# Patient Record
Sex: Female | Born: 1995 | Race: White | Hispanic: No | Marital: Single | Smoking: Never smoker
Health system: Southern US, Community
[De-identification: ages and names within clinical notes are randomized; demographics above are authoritative.]

## PROBLEM LIST (undated history)

## (undated) DIAGNOSIS — D649 Anemia, unspecified: Secondary | ICD-10-CM

## (undated) DIAGNOSIS — Z8759 Personal history of other complications of pregnancy, childbirth and the puerperium: Secondary | ICD-10-CM

## (undated) DIAGNOSIS — Z8679 Personal history of other diseases of the circulatory system: Secondary | ICD-10-CM

## (undated) DIAGNOSIS — Z8619 Personal history of other infectious and parasitic diseases: Secondary | ICD-10-CM

## (undated) DIAGNOSIS — K8050 Calculus of bile duct without cholangitis or cholecystitis without obstruction: Secondary | ICD-10-CM

## (undated) DIAGNOSIS — Z9289 Personal history of other medical treatment: Secondary | ICD-10-CM

## (undated) HISTORY — PX: NO PAST SURGERIES: SHX2092

---

## 2014-11-21 DIAGNOSIS — Z87898 Personal history of other specified conditions: Secondary | ICD-10-CM

## 2014-11-21 DIAGNOSIS — Z8679 Personal history of other diseases of the circulatory system: Secondary | ICD-10-CM

## 2014-11-21 DIAGNOSIS — Z8619 Personal history of other infectious and parasitic diseases: Secondary | ICD-10-CM

## 2014-11-21 HISTORY — DX: Personal history of other infectious and parasitic diseases: Z86.19

## 2014-11-21 HISTORY — DX: Personal history of other specified conditions: Z87.898

## 2014-11-21 HISTORY — DX: Personal history of other diseases of the circulatory system: Z86.79

## 2014-12-13 ENCOUNTER — Emergency Department (HOSPITAL_COMMUNITY): Payer: Medicaid Other

## 2014-12-13 ENCOUNTER — Other Ambulatory Visit (HOSPITAL_COMMUNITY): Payer: Self-pay

## 2014-12-13 ENCOUNTER — Inpatient Hospital Stay (HOSPITAL_COMMUNITY)
Admission: EM | Admit: 2014-12-13 | Discharge: 2014-12-20 | DRG: 770 | Disposition: A | Discharge status: Home / Self Care | Payer: Medicaid Other | Attending: Internal Medicine | Admitting: Internal Medicine

## 2014-12-13 ENCOUNTER — Encounter (HOSPITAL_COMMUNITY): Payer: Self-pay | Admitting: Physical Medicine and Rehabilitation

## 2014-12-13 DIAGNOSIS — O0338 Urinary tract infection following incomplete spontaneous abortion: Secondary | ICD-10-CM | POA: Diagnosis present

## 2014-12-13 DIAGNOSIS — E860 Dehydration: Secondary | ICD-10-CM | POA: Diagnosis present

## 2014-12-13 DIAGNOSIS — R1011 Right upper quadrant pain: Secondary | ICD-10-CM | POA: Insufficient documentation

## 2014-12-13 DIAGNOSIS — O99612 Diseases of the digestive system complicating pregnancy, second trimester: Secondary | ICD-10-CM | POA: Diagnosis present

## 2014-12-13 DIAGNOSIS — R7881 Bacteremia: Secondary | ICD-10-CM | POA: Diagnosis present

## 2014-12-13 DIAGNOSIS — D62 Acute posthemorrhagic anemia: Secondary | ICD-10-CM | POA: Diagnosis not present

## 2014-12-13 DIAGNOSIS — D6959 Other secondary thrombocytopenia: Secondary | ICD-10-CM | POA: Diagnosis present

## 2014-12-13 DIAGNOSIS — Z9289 Personal history of other medical treatment: Secondary | ICD-10-CM

## 2014-12-13 DIAGNOSIS — Z452 Encounter for adjustment and management of vascular access device: Secondary | ICD-10-CM | POA: Insufficient documentation

## 2014-12-13 DIAGNOSIS — K8010 Calculus of gallbladder with chronic cholecystitis without obstruction: Secondary | ICD-10-CM | POA: Diagnosis present

## 2014-12-13 DIAGNOSIS — E872 Acidosis: Secondary | ICD-10-CM | POA: Diagnosis present

## 2014-12-13 DIAGNOSIS — J810 Acute pulmonary edema: Secondary | ICD-10-CM | POA: Diagnosis present

## 2014-12-13 DIAGNOSIS — Z791 Long term (current) use of non-steroidal anti-inflammatories (NSAID): Secondary | ICD-10-CM

## 2014-12-13 DIAGNOSIS — R6521 Severe sepsis with septic shock: Secondary | ICD-10-CM | POA: Diagnosis present

## 2014-12-13 DIAGNOSIS — D696 Thrombocytopenia, unspecified: Secondary | ICD-10-CM | POA: Diagnosis present

## 2014-12-13 DIAGNOSIS — Z3A16 16 weeks gestation of pregnancy: Secondary | ICD-10-CM | POA: Diagnosis present

## 2014-12-13 DIAGNOSIS — K8000 Calculus of gallbladder with acute cholecystitis without obstruction: Secondary | ICD-10-CM | POA: Diagnosis present

## 2014-12-13 DIAGNOSIS — O0332 Renal failure following incomplete spontaneous abortion: Secondary | ICD-10-CM | POA: Diagnosis present

## 2014-12-13 DIAGNOSIS — E876 Hypokalemia: Secondary | ICD-10-CM | POA: Insufficient documentation

## 2014-12-13 DIAGNOSIS — R000 Tachycardia, unspecified: Secondary | ICD-10-CM

## 2014-12-13 DIAGNOSIS — A419 Sepsis, unspecified organism: Secondary | ICD-10-CM | POA: Diagnosis present

## 2014-12-13 DIAGNOSIS — O0337 Sepsis following incomplete spontaneous abortion: Secondary | ICD-10-CM | POA: Diagnosis present

## 2014-12-13 DIAGNOSIS — F419 Anxiety disorder, unspecified: Secondary | ICD-10-CM | POA: Diagnosis present

## 2014-12-13 DIAGNOSIS — E8770 Fluid overload, unspecified: Secondary | ICD-10-CM | POA: Diagnosis present

## 2014-12-13 DIAGNOSIS — K7200 Acute and subacute hepatic failure without coma: Secondary | ICD-10-CM | POA: Diagnosis present

## 2014-12-13 DIAGNOSIS — E861 Hypovolemia: Secondary | ICD-10-CM | POA: Diagnosis present

## 2014-12-13 DIAGNOSIS — J811 Chronic pulmonary edema: Secondary | ICD-10-CM | POA: Diagnosis present

## 2014-12-13 DIAGNOSIS — O039 Complete or unspecified spontaneous abortion without complication: Secondary | ICD-10-CM | POA: Diagnosis present

## 2014-12-13 DIAGNOSIS — O0381 Shock following complete or unspecified spontaneous abortion: Secondary | ICD-10-CM | POA: Diagnosis not present

## 2014-12-13 LAB — I-STAT CG4 LACTIC ACID, ED
LACTIC ACID, VENOUS: 5.26 mmol/L — AB (ref 0.5–2.0)
Lactic Acid, Venous: 3.23 mmol/L (ref 0.5–2.0)

## 2014-12-13 LAB — CBC WITH DIFFERENTIAL/PLATELET
Basophils Absolute: 0 10*3/uL (ref 0.0–0.1)
Basophils Relative: 0 % (ref 0–1)
EOS PCT: 0 % (ref 0–5)
Eosinophils Absolute: 0 10*3/uL (ref 0.0–0.7)
HEMATOCRIT: 33 % — AB (ref 36.0–46.0)
Hemoglobin: 11.5 g/dL — ABNORMAL LOW (ref 12.0–15.0)
LYMPHS PCT: 3 % — AB (ref 12–46)
Lymphs Abs: 0.3 10*3/uL — ABNORMAL LOW (ref 0.7–4.0)
MCH: 29.3 pg (ref 26.0–34.0)
MCHC: 34.8 g/dL (ref 30.0–36.0)
MCV: 84 fL (ref 78.0–100.0)
Monocytes Absolute: 0.1 10*3/uL (ref 0.1–1.0)
Monocytes Relative: 1 % — ABNORMAL LOW (ref 3–12)
NEUTROS ABS: 9.8 10*3/uL — AB (ref 1.7–7.7)
NEUTROS PCT: 96 % — AB (ref 43–77)
Platelets: 111 10*3/uL — ABNORMAL LOW (ref 150–400)
RBC: 3.93 MIL/uL (ref 3.87–5.11)
RDW: 15.2 % (ref 11.5–15.5)
WBC: 10.2 10*3/uL (ref 4.0–10.5)

## 2014-12-13 LAB — COMPREHENSIVE METABOLIC PANEL
ALBUMIN: 2.6 g/dL — AB (ref 3.5–5.2)
ALT: 49 U/L — ABNORMAL HIGH (ref 0–35)
AST: 92 U/L — AB (ref 0–37)
Alkaline Phosphatase: 535 U/L — ABNORMAL HIGH (ref 39–117)
Anion gap: 11 (ref 5–15)
BUN: 16 mg/dL (ref 6–23)
CALCIUM: 8.7 mg/dL (ref 8.4–10.5)
CO2: 17 mmol/L — ABNORMAL LOW (ref 19–32)
CREATININE: 1.21 mg/dL — AB (ref 0.50–1.10)
Chloride: 103 mmol/L (ref 96–112)
GFR calc Af Amer: 74 mL/min — ABNORMAL LOW (ref >90)
GFR calc non Af Amer: 64 mL/min — ABNORMAL LOW (ref >90)
Glucose, Bld: 95 mg/dL (ref 70–99)
Potassium: 2.7 mmol/L — CL (ref 3.5–5.1)
SODIUM: 131 mmol/L — AB (ref 135–145)
Total Bilirubin: 2 mg/dL — ABNORMAL HIGH (ref 0.3–1.2)
Total Protein: 5.9 g/dL — ABNORMAL LOW (ref 6.0–8.3)

## 2014-12-13 LAB — URINE MICROSCOPIC-ADD ON

## 2014-12-13 LAB — URINALYSIS, ROUTINE W REFLEX MICROSCOPIC
Glucose, UA: NEGATIVE mg/dL
KETONES UR: 15 mg/dL — AB
NITRITE: POSITIVE — AB
PROTEIN: 100 mg/dL — AB
Specific Gravity, Urine: 1.017 (ref 1.005–1.030)
UROBILINOGEN UA: 1 mg/dL (ref 0.0–1.0)
pH: 5.5 (ref 5.0–8.0)

## 2014-12-13 LAB — I-STAT BETA HCG BLOOD, ED (MC, WL, AP ONLY)

## 2014-12-13 LAB — I-STAT CHEM 8, ED
BUN: 19 mg/dL (ref 6–23)
CALCIUM ION: 1.15 mmol/L (ref 1.12–1.23)
CHLORIDE: 105 mmol/L (ref 96–112)
CREATININE: 1.2 mg/dL — AB (ref 0.50–1.10)
Glucose, Bld: 116 mg/dL — ABNORMAL HIGH (ref 70–99)
HCT: 47 % — ABNORMAL HIGH (ref 36.0–46.0)
Hemoglobin: 16 g/dL — ABNORMAL HIGH (ref 12.0–15.0)
POTASSIUM: 4.3 mmol/L (ref 3.5–5.1)
Sodium: 144 mmol/L (ref 135–145)
TCO2: 25 mmol/L (ref 0–100)

## 2014-12-13 LAB — WET PREP, GENITAL
Clue Cells Wet Prep HPF POC: NONE SEEN
Trich, Wet Prep: NONE SEEN

## 2014-12-13 LAB — RH IG WORKUP (INCLUDES ABO/RH)
ABO/RH(D): A POS
GESTATIONAL AGE(WKS): 10

## 2014-12-13 LAB — RAPID URINE DRUG SCREEN, HOSP PERFORMED
Amphetamines: NOT DETECTED
Barbiturates: NOT DETECTED
Benzodiazepines: NOT DETECTED
Cocaine: NOT DETECTED
OPIATES: NOT DETECTED
TETRAHYDROCANNABINOL: NOT DETECTED

## 2014-12-13 LAB — LIPASE, BLOOD: LIPASE: 20 U/L (ref 11–59)

## 2014-12-13 MED ORDER — SODIUM CHLORIDE 0.9 % IV SOLN
Freq: Once | INTRAVENOUS | Status: AC
  Administered 2014-12-13: 19:00:00 via INTRAVENOUS

## 2014-12-13 MED ORDER — POTASSIUM CHLORIDE 10 MEQ/100ML IV SOLN
10.0000 meq | INTRAVENOUS | Status: AC
  Administered 2014-12-13 (×2): 10 meq via INTRAVENOUS
  Filled 2014-12-13 (×2): qty 100

## 2014-12-13 MED ORDER — SODIUM CHLORIDE 0.9 % IV BOLUS (SEPSIS)
1000.0000 mL | Freq: Once | INTRAVENOUS | Status: AC
Start: 2014-12-13 — End: 2014-12-13
  Administered 2014-12-13: 1000 mL via INTRAVENOUS

## 2014-12-13 MED ORDER — SODIUM CHLORIDE 0.9 % IV BOLUS (SEPSIS)
1000.0000 mL | Freq: Once | INTRAVENOUS | Status: AC
  Administered 2014-12-13: 1000 mL via INTRAVENOUS

## 2014-12-13 MED ORDER — PIPERACILLIN-TAZOBACTAM 3.375 G IVPB 30 MIN: 3.3750 g | Freq: Once | INTRAVENOUS | Status: DC

## 2014-12-13 MED ORDER — DEXTROSE 5 % IV SOLN
0.0000 ug/min | Freq: Once | INTRAVENOUS | Status: AC
  Administered 2014-12-13: 5 ug/min via INTRAVENOUS
  Filled 2014-12-13: qty 1

## 2014-12-13 MED ORDER — SODIUM CHLORIDE 0.9 % IV SOLN: 250.0000 mL | INTRAVENOUS | Status: DC | PRN

## 2014-12-13 MED ORDER — PIPERACILLIN-TAZOBACTAM IN DEX 2-0.25 GM/50ML IV SOLN
2.2500 g | Freq: Once | INTRAVENOUS | Status: AC
  Administered 2014-12-13: 2.25 g via INTRAVENOUS
  Filled 2014-12-13 (×2): qty 50

## 2014-12-13 MED ORDER — PHENYLEPHRINE HCL 10 MG/ML IJ SOLN
30.0000 ug/min | INTRAMUSCULAR | Status: DC
  Administered 2014-12-13: 30 ug/min via INTRAVENOUS
  Administered 2014-12-14: 65 ug/min via INTRAVENOUS
  Filled 2014-12-13 (×2): qty 1

## 2014-12-13 MED ORDER — ONDANSETRON HCL 4 MG/2ML IJ SOLN
4.0000 mg | Freq: Once | INTRAMUSCULAR | Status: AC
  Administered 2014-12-13: 4 mg via INTRAVENOUS
  Filled 2014-12-13: qty 2

## 2014-12-13 MED ORDER — POTASSIUM CHLORIDE 10 MEQ/100ML IV SOLN
10.0000 meq | Freq: Once | INTRAVENOUS | Status: AC
Start: 2014-12-13 — End: 2014-12-13
  Administered 2014-12-13: 10 meq via INTRAVENOUS
  Filled 2014-12-13: qty 100

## 2014-12-13 MED ORDER — ONDANSETRON HCL 4 MG/2ML IJ SOLN: 4.0000 mg | Freq: Four times a day (QID) | INTRAMUSCULAR | Status: DC | PRN

## 2014-12-13 MED ORDER — PIPERACILLIN-TAZOBACTAM 3.375 G IVPB
3.3750 g | Freq: Three times a day (TID) | INTRAVENOUS | Status: DC
Start: 2014-12-14 — End: 2014-12-18
  Administered 2014-12-14 – 2014-12-18 (×14): 3.375 g via INTRAVENOUS
  Filled 2014-12-13 (×17): qty 50

## 2014-12-13 MED ORDER — SODIUM CHLORIDE 0.9 % IV SOLN: INTRAVENOUS | Status: DC

## 2014-12-13 MED ORDER — HEPARIN SODIUM (PORCINE) 5000 UNIT/ML IJ SOLN
5000.0000 [IU] | Freq: Three times a day (TID) | INTRAMUSCULAR | Status: DC
  Administered 2014-12-14 – 2014-12-16 (×8): 5000 [IU] via SUBCUTANEOUS
  Filled 2014-12-13 (×12): qty 1

## 2014-12-13 NOTE — ED Notes (Addendum)
Floor called that room is ready

## 2014-12-13 NOTE — ED Notes (Signed)
ED NP at bedside

## 2014-12-13 NOTE — ED Notes (Signed)
Ultrasound at bedside

## 2014-12-13 NOTE — ED Notes (Signed)
2nd CG[facility]4 shown to [facility]

## 2014-12-13 NOTE — ED Notes (Signed)
Dr. Donnald GarrePfeiffer now requesting to give Zosyn.

## 2014-12-13 NOTE — Progress Notes (Signed)
ANTIBIOTIC CONSULT NOTE - INITIAL  Pharmacy Consult for Zosyn Indication: cholecystitis   No Known Allergies  Patient Measurements: Height: 5\' 5"  (165.1 cm) Weight: 105 lb (47.628 kg) IBW/kg (Calculated) : 57  Vital Signs: Temp: 98.9 F (37.2 C) (02/23 1425) Temp Source: Oral (02/23 1425) BP: 78/44 mmHg (02/23 2000) Pulse Rate: 105 (02/23 2000) Intake/Output from previous day:   Intake/Output from this shift:    Labs:  Recent Labs  12/13/14 1337 12/13/14 1835  WBC 10.2  --   HGB 11.5* 16.0*  PLT 111*  --   CREATININE 1.21* 1.20*   Estimated Creatinine Clearance: 56.2 mL/min (by C-G formula based on Cr of 1.2). No results for input(s): VANCOTROUGH, VANCOPEAK, VANCORANDOM, GENTTROUGH, GENTPEAK, GENTRANDOM, TOBRATROUGH, TOBRAPEAK, TOBRARND, AMIKACINPEAK, AMIKACINTROU, AMIKACIN in the last 72 hours.   Microbiology: Recent Results (from the past 720 hour(s))  Wet prep, genital     Status: Abnormal   Collection Time: 12/13/14  4:51 PM  Result Value Ref Range Status   Yeast Wet Prep HPF POC FEW (A) NONE SEEN Final   Trich, Wet Prep NONE SEEN NONE SEEN Final   Clue Cells Wet Prep HPF POC NONE SEEN NONE SEEN Final   WBC, Wet Prep HPF POC FEW (A) NONE SEEN Final    Medical History: History reviewed. No pertinent past medical history.  Medications:   (Not in a hospital admission)   Assessment: 19 yo F presents on 2/23 with vomiting and chills. Ultimately leading to septic shock. Pharmacy to dose Zosyn for cholecystitis. Pt is afebrile, WBC wnl. SCr 1.2, CrCl ~3656ml/min. Pt received Zosyn 2.25g IV x1 in the ED  Goal of Therapy:  Resolution of infection  Plan:  Start Zosyn 3.375g IV Q8 Monitor renal function, clinical picture  Armandina StammerBATCHELDER,Laith Antonelli J 12/13/2014,8:29 PM

## 2014-12-13 NOTE — ED Provider Notes (Signed)
CSN: [id number]     Arrival date & time 12/13/14  1250 History   First MD Initiated Contact with Patient 12/13/14 1351     Chief Complaint  Patient presents with  . Emesis     (Consider location/radiation/quality/duration/timing/severity/associated sxs/prior Treatment) HPI Comments: Pt comes with c/o right sided abdominal pain and vomiting times 3 over the last 4 days. Subjective fever. No diarrhea. Hasn't taken any medication for the symptoms. Denies cp or sob. No dysuria. lmp was 2/14. No vaginal discharge. Nothing makes the pain better or worse  The history is provided by the patient. No language interpreter was used.    History reviewed. No pertinent past medical history. History reviewed. No pertinent past surgical history. No family history on file. History  Substance Use Topics  . Smoking status: Never Smoker   . Smokeless tobacco: Not on file  . Alcohol Use: No   OB History    No data available     Review of Systems  All other systems reviewed and are negative.     Allergies  Review of patient's allergies indicates no known allergies.  Home Medications   Prior to Admission medications   Not on File   BP 130/103 mmHg  Pulse 138  Temp(Src) 98.3 F (36.8 C) (Oral)  Resp 18  Ht  (1.651 m)  Wt 105 lb (47.628 kg)  BMI 17.47 kg/m2  SpO2 100% Physical Exam  Constitutional: She is oriented to person, place, and time. She appears well-developed and well-nourished.  HENT:  Head: Normocephalic and atraumatic.  Cardiovascular: Normal rate and regular rhythm.   Pulmonary/Chest: Effort normal and breath sounds normal.  Abdominal: Soft. Bowel sounds are normal. There is tenderness in the right upper quadrant.  Genitourinary: No vaginal discharge found.  Musculoskeletal: Normal range of motion.  Neurological: She is alert and oriented to person, place, and time.  Skin: Skin is warm and dry.  Psychiatric: She has a normal mood and affect.  Nursing note and  vitals reviewed.   ED Course  CRITICAL CARE Performed by: Teressa Lower Authorized by: Teressa Lower Total critical care time: 30 minutes Critical care was time spent personally by me on the following activities: blood draw for specimens, discussions with consultants, examination of patient, evaluation of patient's response to treatment, ordering and review of laboratory studies and ordering and review of radiographic studies.   (including critical care time) Labs Review Labs Reviewed  CBC WITH DIFFERENTIAL/PLATELET - Abnormal; Notable for the following:    Hemoglobin 11.5 (*)    HCT 33.0 (*)    Platelets 111 (*)    Neutrophils Relative % 96 (*)    Lymphocytes Relative 3 (*)    Monocytes Relative 1 (*)    Neutro Abs 9.8 (*)    Lymphs Abs 0.3 (*)    All other components within normal limits  COMPREHENSIVE METABOLIC PANEL - Abnormal; Notable for the following:    Sodium 131 (*)    Potassium 2.7 (*)    CO2 17 (*)    Creatinine, Ser 1.21 (*)    Total Protein 5.9 (*)    Albumin 2.6 (*)    AST 92 (*)    ALT 49 (*)    Alkaline Phosphatase 535 (*)    Total Bilirubin 2.0 (*)    GFR calc non Af Amer 64 (*)    GFR calc Af Amer 74 (*)    All other components within normal limits  I-STAT CG4 LACTIC ACID, ED - Abnormal;  Notable for the following:    Lactic Acid, Venous 5.26 (*)    All other components within normal limits  I-STAT BETA HCG BLOOD, ED ([facility], [facility], [facility] ONLY) - Abnormal; Notable for the following:    I-stat hCG, quantitative >2000.0 (*)    All other components within normal limits  WET PREP, GENITAL  LIPASE, BLOOD  URINALYSIS, ROUTINE W REFLEX MICROSCOPIC  RPR  HIV ANTIBODY (ROUTINE TESTING)  RH IG WORKUP (INCLUDES ABO/RH)  GC/CHLAMYDIA PROBE AMP ([place])    Imaging Review No results found.   EKG Interpretation None      MDM   Final diagnoses:  RUQ pain    Spoke with Dr. Adrian Blackwaterstinson with ob after bedside ultrasound was done and pt is to have a  dating us to see if it is a viable pregnancy and and us of the ruq.pt continues to be alert and oriented. Pt is saying that she would like an abortion. Discussed with pt that it matters at how far along that she is:[facility]    Teressa LowerVrinda Chaela Branscum, NP 12/13/14 1727  Gerhard Munchobert Lockwood, MD 12/14/14 [phone]

## 2014-12-13 NOTE — ED Provider Notes (Signed)
18:40 The patient is reassessed upon returning from pelvic ultrasound. I have assumed care for Dr. Jeraldine LootsLockwood. She reports her pain is controlled if she is not moving around. She reports she continues to have the right upper quadrant pain which she has had since Friday (4 days ago) she denies the pain is radiating into her back. She indicates she has pain from her right upper quadrant to her right mid quadrant. She denies pain in her lower abdomen.  The patient's mental status is alert and appropriate. Her heart rate is 103 on the monitor. She has no respiratory distress and breath sounds are clear. Palpation of the abdomen reveals significant pain in the right upper quadrant. She denies pain to palpation in the lower abdomen.  Consultation has been made by Santiago GladHeather Laisure PA-C to general surgery. They have advised they will see the patient in consult. A consideration for choledocholithiasis was discussed and will be addressed by surgery. The plan will be for a medical admission. Earlier OB had already been consult by initial treating provider and at this time it was advised the patient would not be [facility] and [facility] medical and surgical care.  To this point the patient has received 4 L of normal saline and and peripheral supplemental potassium. The patient's lactic acid has improved however she does continue to be hypotensive with a clear mental status. Zosyn has been ordered previously for possible cholecystitis. Recheck shows her to have intact peripheral pulses radial and pedal. A Refill to the hands and feet is at 2-3 seconds. At this point internal medicine will be consulted and decision making regarding initiating pressors. The patient has reported that she does not wish to keep this pregnancy. She is a sophomore in college and has a number of years ahead of her education and does not feel that she is in an appropriate life situation for keeping the  pregnancy.  17:15 Case was reviewed with Dr. Kathy Breachakesh (intensivist), as  the patient remains hypotensive despite aggressive fluid rehydration Neo-Synephrine will be initiated. She will be assessed in the emergency department by the intensivist and Gen. surgery.  Arby BarretteMarcy Tycen Dockter, MD 12/13/14 [phone]

## 2014-12-13 NOTE — ED Provider Notes (Signed)
5:00 PM Patient signed out to me by Teressa LowerVrinda Pickering, NP at shift change.  Patient presented today with nausea, vomiting, and RUQ abdominal pain.  Urine pregnancy positive.  She did not know that she was pregnant prior to coming to the ED today.  Deliah BostonVrinda had contacted Dr. Adrian BlackwaterStinson with OB/GYN.  He states that the patient does not need to be [facility] and followed by OB/GYN unless pregnancy is viable beyond 23 weeks.  OB Ultrasound and RUQ Ultrasound pending.    IMPRESSION: 1. Gallbladder sludge and a gallstone is noted within gallbladder measures 1.1 cm. Mild thickening of gallbladder wall up to 3.4 mm. Although there is no sonographic Murphy's sign, early cholecystitis cannot be excluded. Clinical correlation is necessary. 2. Normal CBD. 3. No hydronephrosis.  Discussed results with the patient.  She reports that her pain is controlled at this time.  Patient continues to be hypotensive.    6:14 PM Discussed with Dr. Magnus IvanBlackman with General Surgery.  He recommends Medicine Admit and he will be available for consult.    IMPRESSION: 1. Single living intrauterine gestation. The estimated gestational age is 16 weeks and 1 day.  Discussed results of the ultrasound with the patient.    7:00 PM Discussed with Dr. Park BreedKahn with [facility].  He recommends consulting Critical Care.  7:15 PM Dr. Donnald GarrePfeiffer discussed the case with Intensivist who agreed to admit the patient and start pressors.  Santiago GladHeather Shynia Daleo, PA-C 12/15/14 2053  Arby BarretteMarcy Pfeiffer, MD 12/21/14 [phone]

## 2014-12-13 NOTE — H&P (Signed)
PULMONARY / CRITICAL CARE MEDICINE   Name: Jeanette Copeland MRN: [id number] DOB: Sep 28, 1996    ADMISSION DATE:  12/13/2014 CONSULTATION DATE:  12/13/2014  REFERRING MD :  [facility]  CHIEF COMPLAINT:  Septic shock  INITIAL PRESENTATION: Patient presented to the ED with vomiting and chills. She was found to be hypotensive, requiring 5L bolus of NS, to keep systolic pressures in 70s. Found to be pregnant on urine pregnancy with confirmation on ultrasound.  STUDIES:  Abdominal US: sludge with concern for cholecystitis Transvaginal US: Single IUP at 16w 1d  SIGNIFICANT EVENTS: 2/23: patient admitted   HISTORY OF PRESENT ILLNESS:  Patient is a 18yo G1 at [redacted]w[redacted]d that has had sharp right upper quadrant abdominal pain that does not radiate that started 4 days ago. Pain is constant throughout the day and not worse with or after meals. She has taken ibuprofen and Aleve which have not helped much with the pain. The only thing that has helped was placing a pillow on the right side of her back. Symptoms improved slightly three days ago, but returned the next day. Symptoms worsened with nausea, vomiting, headache and chills last night, which resolved this morning. She does not report associated fevers, chills, URI symptoms, urinary symptoms or GU (bleeding/discharge) symptoms. No sick contacts. She did not know she was pregnant prior to this ED admission.  PAST MEDICAL HISTORY :   has no past medical history on file.  has no past surgical history on file. Prior to Admission medications   Medication Sig Start Date End Date Taking? Authorizing Provider  ibuprofen (ADVIL,MOTRIN) 200 MG tablet Take 440 mg by mouth every 6 (six) hours as needed for mild pain.   Yes Historical Provider, MD   No Known Allergies  FAMILY HISTORY:  has no family status information on file.  SOCIAL HISTORY:  reports that she has never smoked. She does not have any smokeless tobacco history on file. She reports that she does  not drink alcohol or use illicit drugs.  REVIEW OF SYSTEMS:   Review of Systems  Constitutional: Positive for chills. Negative for fever, malaise/fatigue and diaphoresis.  Eyes: Negative for blurred vision and double vision.  Respiratory: Negative for cough, shortness of breath and wheezing.   Cardiovascular: Negative for chest pain and palpitations.  Gastrointestinal: Positive for nausea, vomiting and abdominal pain. Negative for diarrhea, constipation and blood in stool.  Genitourinary: Negative for dysuria, urgency and frequency.  Neurological: Positive for headaches. Negative for dizziness, tingling, tremors and loss of consciousness.  All other systems reviewed and are negative.     SUBJECTIVE: on pressors  VITAL SIGNS: Temp:  [98.3 F (36.8 C)-98.9 F (37.2 C)] 98.9 F (37.2 C) (02/23 1425) Pulse Rate:  [106-138] 106 (02/23 1840) Resp:  [18-27] 24 (02/23 1840) BP: (64-130)/(38-103) 72/42 mmHg (02/23 1840) SpO2:  [91 %-100 %] 99 % (02/23 1840) Weight:  [105 lb (47.628 kg)] 105 lb (47.628 kg) (02/23 1308) HEMODYNAMICS:   VENTILATOR SETTINGS:   INTAKE / OUTPUT:  Intake/Output Summary (Last 24 hours) at 12/13/14 1928 Last data filed at 12/13/14 1810  Gross per 24 hour  Intake   4300 ml  Output      0 ml  Net   4300 ml    PHYSICAL EXAMINATION: General:  Well appearing female, no distress Neuro:  Alert, oriented, moves extremities spontaneously. CN grossly intact HEENT:  [place]/AT Cardiovascular:  Regular rate and rhythm, no murmurs Lungs:  Clear to auscultation bilaterally, no wheezing Abdomen:  Soft,  RUQ tenderness, some guarding, no Murphy's sign, no LLQ tenderness, no rebound Musculoskeletal: No edema Skin:  No rashes, intact  LABS:  CBC  Recent Labs Lab 12/13/14 1337 12/13/14 1835  WBC 10.2  --   HGB 11.5* 16.0*  HCT 33.0* 47.0*  PLT 111*  --    Coag's No results for input(s): APTT, INR in the last 168 hours. BMET  Recent Labs Lab 12/13/14 1337  12/13/14 1835  NA 131* 144  K 2.7* 4.3  CL 103 105  CO2 17*  --   BUN 16 19  CREATININE 1.21* 1.20*  GLUCOSE 95 116*   Electrolytes  Recent Labs Lab 12/13/14 1337  CALCIUM 8.7   Sepsis Markers  Recent Labs Lab 12/13/14 1535 12/13/14 1835  LATICACIDVEN 5.26* 3.23*   ABG No results for input(s): PHART, PCO2ART, PO2ART in the last 168 hours. Liver Enzymes  Recent Labs Lab 12/13/14 1337  AST 92*  ALT 49*  ALKPHOS 535*  BILITOT 2.0*  ALBUMIN 2.6*   Cardiac Enzymes No results for input(s): TROPONINI, PROBNP in the last 168 hours. Glucose No results for input(s): GLUCAP in the last 168 hours.  Imaging [place] Abdomen Complete  12/13/2014   CLINICAL DATA:  Right upper quadrant pain, nausea  EXAM: ULTRASOUND ABDOMEN COMPLETE  COMPARISON:  None.  FINDINGS: Gallbladder: Gallbladder sludge and a gallstone is noted within gallbladder measures 1.1 cm. Mild thickening of gallbladder wall up to 3.4 mm. Although there is no sonographic Murphy's sign, early cholecystitis cannot be excluded. Clinical correlation is necessary.  Common bile duct: Diameter: 2.8 mm in diameter within normal limits.  Liver: No focal lesion identified. Within normal limits in parenchymal echogenicity.  IVC: No abnormality visualized.  Pancreas: Visualized portion unremarkable.  Spleen: Size and appearance within normal limits. Measures 10.2 cm in length. Accessory splenule measures 1.8 x 1.7 cm.  Right Kidney: Length: 11.9 cm. Echogenicity within normal limits. No mass or hydronephrosis visualized.  Left Kidney: Length: 11.7 cm. Echogenicity within normal limits. No mass or hydronephrosis visualized.  Abdominal aorta: No aneurysm visualized. Measures up to 1.6 cm in diameter.  Other findings: None.  IMPRESSION: 1. Gallbladder sludge and a gallstone is noted within gallbladder measures 1.1 cm. Mild thickening of gallbladder wall up to 3.4 mm. Although there is no sonographic Murphy's sign, early cholecystitis cannot be  excluded. Clinical correlation is necessary. 2. Normal CBD. 3. No hydronephrosis.   Electronically Signed   By: Natasha MeadLiviu  Pop M.D.   On: 12/13/2014 17:44   [place] Ob Transvaginal  12/13/2014   CLINICAL DATA:  Evaluate dates.  On known last menstrual.  EXAM: LIMITED OBSTETRIC ULTRASOUND  FINDINGS: Number of Fetuses: 1  Heart Rate:  150 bpm  Movement: Yes  Presentation: Cephalic  Placental Location: Anterior  Previa: No  Amniotic Fluid (Subjective):  Normal  BPD:  3.3cm 16w  1d  MATERNAL FINDINGS:  Cervix:  Appears closed.  Uterus/Adnexae:  No abnormality visualized.  IMPRESSION: 1. Single living intrauterine gestation. The estimated gestational age is 16 weeks and 1 day.  This exam is performed on an emergent basis and does not comprehensively evaluate fetal size, dating, or anatomy; follow-up complete OB US should be considered if further fetal assessment is warranted.   Electronically Signed   By: Signa Kellaylor  Stroud M.D.   On: 12/13/2014 19:02    ASSESSMENT / PLAN:  PULMONARY A: No active issues P:   Keep O2 sats greater than 94%  CARDIOVASCULAR A: Septic shock P:  Place central line  if neo not able to dc over next 1 hour cvp guidance Cortisol tsh Neosynephrine to keep MAP >65 Ensure lactic repeat Ensure 30 cc/kg fluid given  RENAL A:  AKI, hypovolemia?, hypokalemia, NONAG acidosis? Etiology? Assess if any loose stools, type B lactic? P:   Repeat bmet volume resus Repeat lactic acid for clearance  GASTROINTESTINAL A:  Acute cholecystitis P:   Surgery consulted, follow recs NPO NS @ 142ml/hr LFT follow up, agree may need GI consultation  Need lipase, amylase  HEMATOLOGIC A:  DVt prevention P:  Watch CBCs Sub q hep  INFECTIOUS A:  Septic shock; probable source from cholecystitis P:   BCx2 2/23 >>> UC 2/23 >>> GC/Chlamydia 2/23 >>> HIV 2/23 >>> RPR 2/23 >>> Abx: Zosyn, start date 2/23, day 1/x Received >30cc/kg NS in ED Trend lactic acid Pct may help for push for  cholecystectomy if plat or rising despite ABX  ENDOCRINE A:  No active issues   P:   Watch CBGs  NEUROLOGIC A:  No active issues P:   RASS goal: 0  REPRODUCTIVE A: G1 at 16w 1d by 2nd trimester [place] P: Avoid known teratogenic medication  FAMILY  - Updates: Family and patient updated at bedside  - Inter-disciplinary family meet or Palliative Care meeting due by: 12/19/2014    TODAY'S SUMMARY: 18yo G1 at [redacted]w[redacted]d with cholecystitis and septic shock requiring vasopressors.     Pulmonary and Critical Care Medicine [facility] Pager: [phone]  12/13/2014, 7:28 PM  STAFF NOTE: I, Rory Percy, MD FACP have personally reviewed patient's available data, including medical history, events of note, physical examination and test results as part of my evaluation. I have discussed with resident/NP and other care providers such as pharmacist, RN and RRT. In addition, I personally evaluated patient and elicited key findings of: septic shock, appreciate gen surgery recs, zosyn, needs line placement, cortisol, cvp goal 12, may need CT abdo (with shielding), may ned ERCP as we look for stone in CBD?, assess lipase, II updated pt and mom, she has receoved well over her 30 cc/kg and neo is rising, with new pregnancy consider MAP 65-70 The patient is critically ill with multiple organ systems failure and requires high complexity decision making for assessment and support, frequent evaluation and titration of therapies, application of advanced monitoring technologies and extensive interpretation of multiple databases.   Critical Care Time devoted to patient care services described in this note is35 Minutes. This time reflects time of care of this signee: Rory Percy, MD FACP. This critical care time does not reflect procedure time, or teaching time or supervisory time of PA/NP/Med student/Med Resident etc but could involve care discussion time. Rest per NP/medical resident whose note  is outlined above and that I agree with   Mcarthur Rossetti. Tyson Alias, MD, FACP Pgr: [phone] [facility] 12/13/2014 11:22 PM

## 2014-12-13 NOTE — Consult Note (Signed)
Reason for Consult:possible cholecystitis Referring Physician: Dr. Veryl Speak Jeanette Copeland is an 19 y.o. female.  HPI: This is a 19 year old female who presents with a four-day history of abdominal pain, nausea, and vomiting. She reports that the pain began in the right upper quadrant on Friday. She has had multiple episodes of emesis. She presented to the emergency department earlier today. During the workup, an ultrasound showed a thickened gallbladder wall. Bile duct was normal. She was found to be dehydrated with an elevated lactic acid level and elevated liver function tests including an elevated bilirubin of 2.0. She was also found to be pregnant with an ultrasound confirming an approximate [redacted] week gestational pregnancy. She has been hypertensive and tachycardic in the emergency department. Currently, she denies abdominal pain although she is tender in the right upper quadrant. She has ongoing nausea with dry heaves. She has no previous history of abdominal discomfort. Until this point, she has been healthy.  History reviewed. No pertinent past medical history.  History reviewed. No pertinent past surgical history.  No family history on file.  Social History:  reports that she has never smoked. She does not have any smokeless tobacco history on file. She reports that she does not drink alcohol or use illicit drugs.  Allergies: No Known Allergies  Medications: I have reviewed the patient's current medications.  Results for orders placed or performed during the hospital encounter of 12/13/14 (from the past 48 hour(s))  CBC with Differential     Status: Abnormal   Collection Time: 12/13/14  1:37 PM  Result Value Ref Range   WBC 10.2 4.0 - 10.5 K/uL   RBC 3.93 3.87 - 5.11 MIL/uL   Hemoglobin 11.5 (L) 12.0 - 15.0 g/dL   HCT 33.0 (L) 36.0 - 46.0 %   MCV 84.0 78.0 - 100.0 fL   MCH 29.3 26.0 - 34.0 pg   MCHC 34.8 30.0 - 36.0 g/dL   RDW 15.2 11.5 - 15.5 %   Platelets 111 (L) 150 - 400 K/uL     Comment: REPEATED TO VERIFY PLATELET COUNT CONFIRMED BY SMEAR    Neutrophils Relative % 96 (H) 43 - 77 %   Lymphocytes Relative 3 (L) 12 - 46 %   Monocytes Relative 1 (L) 3 - 12 %   Eosinophils Relative 0 0 - 5 %   Basophils Relative 0 0 - 1 %   Neutro Abs 9.8 (H) 1.7 - 7.7 K/uL   Lymphs Abs 0.3 (L) 0.7 - 4.0 K/uL   Monocytes Absolute 0.1 0.1 - 1.0 K/uL   Eosinophils Absolute 0.0 0.0 - 0.7 K/uL   Basophils Absolute 0.0 0.0 - 0.1 K/uL   RBC Morphology POLYCHROMASIA PRESENT     Comment: ELLIPTOCYTES   WBC Morphology TOXIC GRANULATION     Comment: DOHLE BODIES VACUOLATED NEUTROPHILS INCREASED BANDS (>20% BANDS)   Comprehensive metabolic panel     Status: Abnormal   Collection Time: 12/13/14  1:37 PM  Result Value Ref Range   Sodium 131 (L) 135 - 145 mmol/L   Potassium 2.7 (LL) 3.5 - 5.1 mmol/L    Comment: CRITICAL RESULT CALLED TO, READ BACK BY AND VERIFIED WITH: GROSE J RN 12/13/14 1428 COSTELLO B REPEATED TO VERIFY    Chloride 103 96 - 112 mmol/L   CO2 17 (L) 19 - 32 mmol/L   Glucose, Bld 95 70 - 99 mg/dL   BUN 16 6 - 23 mg/dL   Creatinine, Ser 1.21 (H) 0.50 - 1.10 mg/dL  Calcium 8.7 8.4 - 10.5 mg/dL   Total Protein 5.9 (L) 6.0 - 8.3 g/dL   Albumin 2.6 (L) 3.5 - 5.2 g/dL   AST 92 (H) 0 - 37 U/L   ALT 49 (H) 0 - 35 U/L   Alkaline Phosphatase 535 (H) 39 - 117 U/L   Total Bilirubin 2.0 (H) 0.3 - 1.2 mg/dL   GFR calc non Af Amer 64 (L) >90 mL/min   GFR calc Af Amer 74 (L) >90 mL/min    Comment: (NOTE) The eGFR has been calculated using the CKD EPI equation. This calculation has not been validated in all clinical situations. eGFR's persistently <90 mL/min signify possible Chronic Kidney Disease.    Anion gap 11 5 - 15  Lipase, blood     Status: None   Collection Time: 12/13/14  1:37 PM  Result Value Ref Range   Lipase 20 11 - 59 U/L  I-Stat Beta hCG blood, ED ([facility], [facility], [facility] only)     Status: Abnormal   Collection Time: 12/13/14  3:33 PM  Result Value Ref Range    I-stat hCG, quantitative >2000.0 (H) <5 mIU/mL   Comment 3            Comment:   GEST. AGE      CONC.  (mIU/mL)   <=1 WEEK        5 - 50     2 WEEKS       50 - 500     3 WEEKS       100 - 10,000     4 WEEKS     1,000 - 30,000        FEMALE AND NON-PREGNANT FEMALE:     LESS THAN 5 mIU/mL   I-Stat CG4 Lactic Acid, ED     Status: Abnormal   Collection Time: 12/13/14  3:35 PM  Result Value Ref Range   Lactic Acid, Venous 5.26 (HH) 0.5 - 2.0 mmol/L   Comment NOTIFIED PHYSICIAN   Wet prep, genital     Status: Abnormal   Collection Time: 12/13/14  4:51 PM  Result Value Ref Range   Yeast Wet Prep HPF POC FEW (A) NONE SEEN   Trich, Wet Prep NONE SEEN NONE SEEN   Clue Cells Wet Prep HPF POC NONE SEEN NONE SEEN   WBC, Wet Prep HPF POC FEW (A) NONE SEEN  Rh IG workup (includes ABO/Rh)     Status: None   Collection Time: 12/13/14  6:13 PM  Result Value Ref Range   Gestational Age(Wks) 10    ABO/RH(D) A POS    No rh immune globuloin NOT A RH IMMUNE GLOBULIN CANDIDATE, PT RH POSITIVE   I-Stat CG4 Lactic Acid, ED     Status: Abnormal   Collection Time: 12/13/14  6:35 PM  Result Value Ref Range   Lactic Acid, Venous 3.23 (HH) 0.5 - 2.0 mmol/L   Comment NOTIFIED PHYSICIAN   I-stat chem 8, ed     Status: Abnormal   Collection Time: 12/13/14  6:35 PM  Result Value Ref Range   Sodium 144 135 - 145 mmol/L   Potassium 4.3 3.5 - 5.1 mmol/L   Chloride 105 96 - 112 mmol/L   BUN 19 6 - 23 mg/dL   Creatinine, Ser 1.20 (H) 0.50 - 1.10 mg/dL   Glucose, Bld 116 (H) 70 - 99 mg/dL   Calcium, Ion 1.15 1.12 - 1.23 mmol/L   TCO2 25 0 - 100 mmol/L   Hemoglobin  16.0 (H) 12.0 - 15.0 g/dL   HCT 47.0 (H) 36.0 - 46.0 %    US Abdomen Complete  12/13/2014   CLINICAL DATA:  Right upper quadrant pain, nausea  EXAM: ULTRASOUND ABDOMEN COMPLETE  COMPARISON:  None.  FINDINGS: Gallbladder: Gallbladder sludge and a gallstone is noted within gallbladder measures 1.1 cm. Mild thickening of gallbladder wall up to 3.4 mm.  Although there is no sonographic Murphy's sign, early cholecystitis cannot be excluded. Clinical correlation is necessary.  Common bile duct: Diameter: 2.8 mm in diameter within normal limits.  Liver: No focal lesion identified. Within normal limits in parenchymal echogenicity.  IVC: No abnormality visualized.  Pancreas: Visualized portion unremarkable.  Spleen: Size and appearance within normal limits. Measures 10.2 cm in length. Accessory splenule measures 1.8 x 1.7 cm.  Right Kidney: Length: 11.9 cm. Echogenicity within normal limits. No mass or hydronephrosis visualized.  Left Kidney: Length: 11.7 cm. Echogenicity within normal limits. No mass or hydronephrosis visualized.  Abdominal aorta: No aneurysm visualized. Measures up to 1.6 cm in diameter.  Other findings: None.  IMPRESSION: 1. Gallbladder sludge and a gallstone is noted within gallbladder measures 1.1 cm. Mild thickening of gallbladder wall up to 3.4 mm. Although there is no sonographic Murphy's sign, early cholecystitis cannot be excluded. Clinical correlation is necessary. 2. Normal CBD. 3. No hydronephrosis.   Electronically Signed   By: Lahoma Crocker M.D.   On: 12/13/2014 17:44   US Ob Transvaginal  12/13/2014   CLINICAL DATA:  Evaluate dates.  On known last menstrual.  EXAM: LIMITED OBSTETRIC ULTRASOUND  FINDINGS: Number of Fetuses: 1  Heart Rate:  150 bpm  Movement: Yes  Presentation: Cephalic  Placental Location: Anterior  Previa: No  Amniotic Fluid (Subjective):  Normal  BPD:  3.3cm 16w  1d  MATERNAL FINDINGS:  Cervix:  Appears closed.  Uterus/Adnexae:  No abnormality visualized.  IMPRESSION: 1. Single living intrauterine gestation. The estimated gestational age is 57 weeks and 1 day.  This exam is performed on an emergent basis and does not comprehensively evaluate fetal size, dating, or anatomy; follow-up complete OB US should be considered if further fetal assessment is warranted.   Electronically Signed   By: Kerby Moors M.D.   On:  12/13/2014 19:02    Review of Systems  Constitutional: Positive for chills.  Gastrointestinal: Positive for nausea, vomiting and abdominal pain.   Blood pressure 78/44, pulse 105, temperature 98.9 F (37.2 C), temperature source Oral, resp. rate 24, height 5' 5"  (1.651 m), weight 105 lb (47.628 kg), SpO2 100 %. Physical Exam  Constitutional: She is oriented to person, place, and time. She appears well-developed and well-nourished. She appears distressed.  Small female currently having dry heaves  HENT:  Head: Normocephalic and atraumatic.  Right Ear: External ear normal.  Left Ear: External ear normal.  Nose: Nose normal.  Mouth/Throat: No oropharyngeal exudate.  Eyes: Conjunctivae are normal. Pupils are equal, round, and reactive to light. No scleral icterus.  Neck: Normal range of motion. No tracheal deviation present.  Cardiovascular: Regular rhythm, normal heart sounds and intact distal pulses.   No murmur heard. Tachycardic  Respiratory: Effort normal and breath sounds normal. No respiratory distress. She has no wheezes.  GI: Soft. She exhibits no distension. There is tenderness. There is guarding.  There is tenderness with guarding in the right upper quadrant.  Musculoskeletal: Normal range of motion. She exhibits no edema or tenderness.  Lymphadenopathy:    She has no cervical adenopathy.  Neurological:  She is alert and oriented to person, place, and time.  Skin: Skin is dry. No erythema.  Psychiatric: Her behavior is normal. Judgment normal.    Assessment/Plan: Acute cholecystitis complicated by possible choledocholithiasis and cholangitis as well as early pregnancy. She is quite dehydrated. Fluid resuscitation is ongoing. Some of the nausea and vomiting may be secondary to the pregnancy. She is being admitted to the hospital. If her liver function tests increase, she will need GI consultation to consider ERCP with bile duct decompression. If her test improved, she may need  cholecystectomy tomorrow. We may need to consider a CAT scan of her abdomen if the diagnosis does not remain totally certain despite the pregnancy. I did not discuss the pregnancy with her as there was family present and she did not want this discussed in front of family apparently. We will follow her closely with you.  Mehul Rudin A 12/13/2014, 8:23 PM

## 2014-12-13 NOTE — ED Notes (Signed)
Foye ClockKristina, EMT attempted in and out cath without urine return. Deliah BostonVrinda NP aware.

## 2014-12-13 NOTE — ED Notes (Signed)
Pickering, NP notified of patients potassium.

## 2014-12-13 NOTE — ED Notes (Addendum)
Patient requesting that anything related to her care not be discussed with or in front of anyone but herself unless she has given permission.

## 2014-12-13 NOTE — ED Notes (Signed)
CG-4 reported to HungaryVarinda Pickering-NP

## 2014-12-13 NOTE — ED Notes (Signed)
Pt presents to department for evaluation of R sided rib pain, nausea/vomiting and fatigue. Ongoing since Friday. Denies pain upon arrival. Pt is alert and oriented x4.

## 2014-12-13 NOTE — ED Notes (Signed)
Patient is unable to give a urine specimen at this time  

## 2014-12-13 NOTE — ED Notes (Signed)
Spoke with US, requested bedside US to be performed due to need for continuous monitoring

## 2014-12-13 NOTE — ED Notes (Signed)
Writer called and Camera operatorfloor secretary states that bed is brought but waiting for environmental services to clean the bed.

## 2014-12-13 NOTE — ED Notes (Signed)
Report given to floor RN, Huntley DecSara, states no bed in the room yet. States will call when bed is brought to room.

## 2014-12-13 NOTE — ED Notes (Signed)
Spoke with US regarding need [facility].

## 2014-12-14 ENCOUNTER — Encounter (HOSPITAL_COMMUNITY): Payer: Self-pay | Admitting: Radiology

## 2014-12-14 ENCOUNTER — Inpatient Hospital Stay (HOSPITAL_COMMUNITY): Payer: Medicaid Other

## 2014-12-14 DIAGNOSIS — J810 Acute pulmonary edema: Secondary | ICD-10-CM

## 2014-12-14 DIAGNOSIS — E876 Hypokalemia: Secondary | ICD-10-CM | POA: Insufficient documentation

## 2014-12-14 DIAGNOSIS — R1011 Right upper quadrant pain: Secondary | ICD-10-CM | POA: Insufficient documentation

## 2014-12-14 DIAGNOSIS — J811 Chronic pulmonary edema: Secondary | ICD-10-CM | POA: Insufficient documentation

## 2014-12-14 DIAGNOSIS — Z452 Encounter for adjustment and management of vascular access device: Secondary | ICD-10-CM | POA: Insufficient documentation

## 2014-12-14 LAB — COMPREHENSIVE METABOLIC PANEL
ALBUMIN: 1.9 g/dL — AB (ref 3.5–5.2)
ALT: 47 U/L — ABNORMAL HIGH (ref 0–35)
ANION GAP: 9 (ref 5–15)
AST: 87 U/L — ABNORMAL HIGH (ref 0–37)
Alkaline Phosphatase: 130 U/L — ABNORMAL HIGH (ref 39–117)
BUN: 15 mg/dL (ref 6–23)
CO2: 14 mmol/L — ABNORMAL LOW (ref 19–32)
CREATININE: 1.09 mg/dL (ref 0.50–1.10)
Calcium: 6.9 mg/dL — ABNORMAL LOW (ref 8.4–10.5)
Chloride: 114 mmol/L — ABNORMAL HIGH (ref 96–112)
GFR calc Af Amer: 84 mL/min — ABNORMAL LOW (ref >90)
GFR calc non Af Amer: 73 mL/min — ABNORMAL LOW (ref >90)
Glucose, Bld: 94 mg/dL (ref 70–99)
Potassium: 3.1 mmol/L — ABNORMAL LOW (ref 3.5–5.1)
SODIUM: 137 mmol/L (ref 135–145)
TOTAL PROTEIN: 4.6 g/dL — AB (ref 6.0–8.3)
Total Bilirubin: 1 mg/dL (ref 0.3–1.2)

## 2014-12-14 LAB — URINALYSIS, ROUTINE W REFLEX MICROSCOPIC
Bilirubin Urine: NEGATIVE
GLUCOSE, UA: NEGATIVE mg/dL
KETONES UR: NEGATIVE mg/dL
NITRITE: NEGATIVE
PH: 6 (ref 5.0–8.0)
PROTEIN: 30 mg/dL — AB
Specific Gravity, Urine: 1.011 (ref 1.005–1.030)
Urobilinogen, UA: 1 mg/dL (ref 0.0–1.0)

## 2014-12-14 LAB — CARBOXYHEMOGLOBIN
CARBOXYHEMOGLOBIN: 0.8 % (ref 0.5–1.5)
Methemoglobin: 0.8 % (ref 0.0–1.5)
O2 Saturation: 70.2 %
Total hemoglobin: 10.5 g/dL — ABNORMAL LOW (ref 12.0–16.0)

## 2014-12-14 LAB — BASIC METABOLIC PANEL
Anion gap: 7 (ref 5–15)
BUN: 13 mg/dL (ref 6–23)
CO2: 17 mmol/L — ABNORMAL LOW (ref 19–32)
CREATININE: 0.88 mg/dL (ref 0.50–1.10)
Calcium: 7.4 mg/dL — ABNORMAL LOW (ref 8.4–10.5)
Chloride: 117 mmol/L — ABNORMAL HIGH (ref 96–112)
GFR calc Af Amer: >90 mL/min (ref >90)
Glucose, Bld: 84 mg/dL (ref 70–99)
Potassium: 3.5 mmol/L (ref 3.5–5.1)
Sodium: 141 mmol/L (ref 135–145)

## 2014-12-14 LAB — GLUCOSE, CAPILLARY: Glucose-Capillary: 108 mg/dL — ABNORMAL HIGH (ref 70–99)

## 2014-12-14 LAB — LACTIC ACID, PLASMA
Lactic Acid, Venous: 0.8 mmol/L (ref 0.5–2.0)
Lactic Acid, Venous: 0.8 mmol/L (ref 0.5–2.0)

## 2014-12-14 LAB — URINE CULTURE: Colony Count: >=100000

## 2014-12-14 LAB — URINE MICROSCOPIC-ADD ON

## 2014-12-14 LAB — CBC
HCT: 28.4 % — ABNORMAL LOW (ref 36.0–46.0)
Hemoglobin: 10 g/dL — ABNORMAL LOW (ref 12.0–15.0)
MCH: 29 pg (ref 26.0–34.0)
MCHC: 35.2 g/dL (ref 30.0–36.0)
MCV: 82.3 fL (ref 78.0–100.0)
PLATELETS: 88 10*3/uL — AB (ref 150–400)
RBC: 3.45 MIL/uL — AB (ref 3.87–5.11)
RDW: 15.8 % — ABNORMAL HIGH (ref 11.5–15.5)
WBC: 23.2 10*3/uL — ABNORMAL HIGH (ref 4.0–10.5)

## 2014-12-14 LAB — LIPASE, BLOOD: LIPASE: 18 U/L (ref 11–59)

## 2014-12-14 LAB — TSH: TSH: 1.782 u[IU]/mL (ref 0.350–4.500)

## 2014-12-14 LAB — GC/CHLAMYDIA PROBE AMP (~~LOC~~) NOT AT ARMC
Chlamydia: NEGATIVE
NEISSERIA GONORRHEA: NEGATIVE

## 2014-12-14 LAB — CORTISOL: Cortisol, Plasma: 36.3 ug/dL

## 2014-12-14 LAB — PHOSPHORUS: Phosphorus: 2.1 mg/dL — ABNORMAL LOW (ref 2.3–4.6)

## 2014-12-14 LAB — AMYLASE: AMYLASE: 23 U/L (ref 0–105)

## 2014-12-14 LAB — HIV ANTIBODY (ROUTINE TESTING W REFLEX): HIV Screen 4th Generation wRfx: NONREACTIVE

## 2014-12-14 LAB — RPR: RPR: NONREACTIVE

## 2014-12-14 LAB — MRSA PCR SCREENING: MRSA by PCR: NEGATIVE

## 2014-12-14 LAB — MAGNESIUM: Magnesium: 1.3 mg/dL — ABNORMAL LOW (ref 1.5–2.5)

## 2014-12-14 MED ORDER — CETYLPYRIDINIUM CHLORIDE 0.05 % MT LIQD: 7.0000 mL | Freq: Two times a day (BID) | OROMUCOSAL | Status: DC

## 2014-12-14 MED ORDER — PHENYLEPHRINE HCL 10 MG/ML IJ SOLN
30.0000 ug/min | INTRAVENOUS | Status: DC
  Administered 2014-12-14: 60 ug/min via INTRAVENOUS
  Administered 2014-12-16: 30 ug/min via INTRAVENOUS
  Filled 2014-12-14 (×3): qty 4

## 2014-12-14 MED ORDER — POTASSIUM CHLORIDE 2 MEQ/ML IV SOLN
INTRAVENOUS | Status: DC
  Administered 2014-12-14: 06:00:00 via INTRAVENOUS
  Filled 2014-12-14 (×2): qty 1000

## 2014-12-14 MED ORDER — CHLORHEXIDINE GLUCONATE 0.12 % MT SOLN
15.0000 mL | Freq: Two times a day (BID) | OROMUCOSAL | Status: DC
  Administered 2014-12-14 – 2014-12-15 (×2): 15 mL via OROMUCOSAL
  Filled 2014-12-14 (×2): qty 15

## 2014-12-14 MED ORDER — SODIUM CHLORIDE 0.9 % IV SOLN
INTRAVENOUS | Status: DC
  Administered 2014-12-14: 02:00:00 via INTRAVENOUS

## 2014-12-14 MED ORDER — CETYLPYRIDINIUM CHLORIDE 0.05 % MT LIQD
7.0000 mL | Freq: Two times a day (BID) | OROMUCOSAL | Status: DC
  Administered 2014-12-14: 7 mL via OROMUCOSAL

## 2014-12-14 MED ORDER — SODIUM CHLORIDE 0.45 % IV SOLN
INTRAVENOUS | Status: DC
  Administered 2014-12-14: 09:00:00 via INTRAVENOUS

## 2014-12-14 MED ORDER — POTASSIUM CHLORIDE 10 MEQ/50ML IV SOLN
10.0000 meq | INTRAVENOUS | Status: AC
Start: 2014-12-14 — End: 2014-12-14
  Administered 2014-12-14 (×4): 10 meq via INTRAVENOUS
  Filled 2014-12-14 (×4): qty 50

## 2014-12-14 NOTE — H&P (Signed)
PULMONARY / CRITICAL CARE MEDICINE   Name: Jeanette Copeland MRN: [id number] DOB: [birth year]    ADMISSION DATE:  12/13/2014 CONSULTATION DATE:  12/13/2014  REFERRING MD :  [facility]  CHIEF COMPLAINT:  Septic shock  INITIAL PRESENTATION: Patient presented to the ED with vomiting and chills. She was found to be hypotensive, requiring 5L bolus of NS, to keep systolic pressures in 37C. Found to be pregnant on urine pregnancy with confirmation on ultrasound.  STUDIES:  Abdominal US: sludge with concern for cholecystitis Transvaginal US: Single IUP at 16w 1d  SIGNIFICANT EVENTS: 2/23: patient admitted   HISTORY OF PRESENT ILLNESS:  Patient is a 18yo G1 at 29w1dthat has had sharp right upper quadrant abdominal pain that does not radiate that started 4 days ago. Pain is constant throughout the day and not worse with or after meals. She has taken ibuprofen and Aleve which have not helped much with the pain. The only thing that has helped was placing a pillow on the right side of her back. Symptoms improved slightly three days ago, but returned the next day. Symptoms worsened with nausea, vomiting, headache and chills last night, which resolved this morning. She does not report associated fevers, chills, URI symptoms, urinary symptoms or GU (bleeding/discharge) symptoms. No sick contacts. She did not know she was pregnant prior to this ED admission.  PAST MEDICAL HISTORY :   has a past medical history of Medical history non-contributory.  has past surgical history that includes No past surgeries. Prior to Admission medications   Medication Sig Start Date End Date Taking? Authorizing Provider  ibuprofen (ADVIL,MOTRIN) 200 MG tablet Take 440 mg by mouth every 6 (six) hours as needed for mild pain.   Yes Historical Provider, MD   No Known Allergies  FAMILY HISTORY:  has no family status information on file.  SOCIAL HISTORY:  reports that she has never smoked. She does not have any smokeless  tobacco history on file. She reports that she does not drink alcohol or use illicit drugs.  REVIEW OF SYSTEMS:   ROS     SUBJECTIVE: remains on low dose neo, line ws placed  VITAL SIGNS: Temp:  [97.9 F (36.6 C)-99.1 F (37.3 C)] 97.9 F (36.6 C) (02/24 0822) Pulse Rate:  [78-148] 101 (02/24 0830) Resp:  [18-50] 30 (02/24 0830) BP: (64-130)/(38-103) 98/70 mmHg (02/24 0830) SpO2:  [90 %-100 %] 92 % (02/24 0830) Weight:  [47.628 kg (105 lb)-55.5 kg (122 lb 5.7 oz)] 55.5 kg (122 lb 5.7 oz) (02/24 0142) HEMODYNAMICS: CVP:  [10 mmHg] 10 mmHg VENTILATOR SETTINGS:   INTAKE / OUTPUT:  Intake/Output Summary (Last 24 hours) at 12/14/14 05885Last data filed at 12/14/14 0800  Gross per 24 hour  Intake 5813.55 ml  Output    690 ml  Net 5123.55 ml    PHYSICAL EXAMINATION: General:  Well appearing female, no distress Neuro:  Alert, oriented, moves extremities spontaneously. Anxiety improved HEENT:  [place]/AT Cardiovascular:   s1 s 2 Regular rate and rhythm, no murmurs Lungs:  CTA Abdomen:  Soft, RUQ tenderness mild improved, BS present Musculoskeletal: No edema Skin:  No rashes, intact  LABS:  CBC  Recent Labs Lab 12/13/14 1337 12/13/14 1835 12/14/14 0451  WBC 10.2  --  23.2*  HGB 11.5* 16.0* 10.0*  HCT 33.0* 47.0* 28.4*  PLT 111*  --  88*   Coag's No results for input(s): APTT, INR in the last 168 hours. BMET  Recent Labs Lab 12/13/14 1337 12/13/14 1835  12/14/14 0451  NA 131* 144 137  K 2.7* 4.3 3.1*  CL 103 105 114*  CO2 17*  --  14*  BUN 16 19 15   CREATININE 1.21* 1.20* 1.09  GLUCOSE 95 116* 94   Electrolytes  Recent Labs Lab 12/13/14 1337 12/14/14 0451  CALCIUM 8.7 6.9*  MG  --  1.3*  PHOS  --  2.1*   Sepsis Markers  Recent Labs Lab 12/13/14 1535 12/13/14 1835  LATICACIDVEN 5.26* 3.23*   ABG No results for input(s): PHART, PCO2ART, PO2ART in the last 168 hours. Liver Enzymes  Recent Labs Lab 12/13/14 1337 12/14/14 0451  AST 92* 87*   ALT 49* 47*  ALKPHOS 535* 130*  BILITOT 2.0* 1.0  ALBUMIN 2.6* 1.9*   Cardiac Enzymes No results for input(s): TROPONINI, PROBNP in the last 168 hours. Glucose  Recent Labs Lab 12/14/14 0038  GLUCAP 108*    Imaging US Abdomen Complete  12/13/2014   CLINICAL DATA:  Right upper quadrant pain, nausea  EXAM: ULTRASOUND ABDOMEN COMPLETE  COMPARISON:  None.  FINDINGS: Gallbladder: Gallbladder sludge and a gallstone is noted within gallbladder measures 1.1 cm. Mild thickening of gallbladder wall up to 3.4 mm. Although there is no sonographic Murphy's sign, early cholecystitis cannot be excluded. Clinical correlation is necessary.  Common bile duct: Diameter: 2.8 mm in diameter within normal limits.  Liver: No focal lesion identified. Within normal limits in parenchymal echogenicity.  IVC: No abnormality visualized.  Pancreas: Visualized portion unremarkable.  Spleen: Size and appearance within normal limits. Measures 10.2 cm in length. Accessory splenule measures 1.8 x 1.7 cm.  Right Kidney: Length: 11.9 cm. Echogenicity within normal limits. No mass or hydronephrosis visualized.  Left Kidney: Length: 11.7 cm. Echogenicity within normal limits. No mass or hydronephrosis visualized.  Abdominal aorta: No aneurysm visualized. Measures up to 1.6 cm in diameter.  Other findings: None.  IMPRESSION: 1. Gallbladder sludge and a gallstone is noted within gallbladder measures 1.1 cm. Mild thickening of gallbladder wall up to 3.4 mm. Although there is no sonographic Murphy's sign, early cholecystitis cannot be excluded. Clinical correlation is necessary. 2. Normal CBD. 3. No hydronephrosis.   Electronically Signed   By: Lahoma Crocker M.D.   On: 12/13/2014 17:44   US Ob Transvaginal  12/13/2014   CLINICAL DATA:  Evaluate dates.  On known last menstrual.  EXAM: LIMITED OBSTETRIC ULTRASOUND  FINDINGS: Number of Fetuses: 1  Heart Rate:  150 bpm  Movement: Yes  Presentation: Cephalic  Placental Location: Anterior   Previa: No  Amniotic Fluid (Subjective):  Normal  BPD:  3.3cm 16w  1d  MATERNAL FINDINGS:  Cervix:  Appears closed.  Uterus/Adnexae:  No abnormality visualized.  IMPRESSION: 1. Single living intrauterine gestation. The estimated gestational age is 28 weeks and 1 day.  This exam is performed on an emergent basis and does not comprehensively evaluate fetal size, dating, or anatomy; follow-up complete OB US should be considered if further fetal assessment is warranted.   Electronically Signed   By: Kerby Moors M.D.   On: 12/13/2014 19:02   Dg Chest Port 1 View  12/14/2014   CLINICAL DATA:  Septic shock. Central line placement. Sixteen weeks pregnant.  EXAM: PORTABLE CHEST - 1 VIEW  COMPARISON:  None.  FINDINGS: Right central venous catheter. Tip is at the level of the cavoatrial junction. No pneumothorax. Normal heart size and pulmonary vascularity. Bilateral perihilar basilar infiltrates. Possible pleural effusions. No pneumothorax.  IMPRESSION: Right central venous catheter appears in satisfactory  position. No pneumothorax. Basal perihilar infiltrates bilaterally with possible pleural effusions.   Electronically Signed   By: Lucienne Capers M.D.   On: 12/14/2014 00:59    ASSESSMENT / PLAN:  PULMONARY A: pulm edema, unimpressed PNA as source P:   Keep O2 sats greater than 94% Even balance goals No lasix needed as on min O2 pcxr in am   CARDIOVASCULAR A: Septic shock, r/o rel AI P:  kvo now, pulm edema cvp 10 Neo to MAP 60 today as renal fxn improved (mom reports low baseline BP in family) No need  Vaso Cortisol awaited, no empiric steroids with pregnancy  RENAL A:  AKI, hypovolemia resolved, hypokalemia, NONAG met acidosis P:   Repeat bmet in am  Avoid saline Use 1/2 NS bmet in pm NO role bicarb as we will dc saline  volume resus over  k supp  GASTROINTESTINAL A:  ??Acute cholecystitis, abdo pain improved P:   NPO She has made clincial progress, d/w surgery, will continued  ABX and follow clinical status CT abdo if declines LFt again in am  May consider repeat noninvasive [place]  HEMATOLOGIC A:  DVt prevention, thrombocytopenia (se4psis, dilution), higher risk clot (ICU, preggy) P:  Watch CBCs in am further Sub q hep, continued but dc in am if drops kvo Treating sepsis  INFECTIOUS A:  Septic shock; Unclear abdo source?, r/o urine source (neg [place] hdro or pyelo)) P:   BCx2 2/23 >>> UC 2/23 >>> GC/Chlamydia 2/23 >>> HIV 2/23 >>> RPR 2/23 >>> Abx:  Zosyn, start date 2/23, day 1/x  Pct no in pregnancy Pending all HIV as well Follow neo needs If declines - CT, drain? Reluctant to add vanc ro additional agents from home and pregnancy risk  ENDOCRINE A:  R.o rel AI P:   Watch CBGs tsh wnl Cortisol awaited  NEUROLOGIC A:  Anxiety P:   RASS goal: 0 counseling  REPRODUCTIVE A: G1 at 16w 1d by 2nd trimester [place] P: Avoid known teratogenic medication avoid vanc  FAMILY  - Updates: Family and patient updated at bedside daily  - Inter-disciplinary family meet or Palliative Care meeting due by: 12/19/2014   ccm time 30 min   Lavon Paganini. Titus Mould, MD, [place] Pgr: [facility]

## 2014-12-14 NOTE — Procedures (Signed)
Central Venous Catheter Insertion Procedure Note Albertha GheeBreanna Losasso [id number] 12/02/1995  Procedure: Insertion of Central Venous Catheter Indications: Assessment of intravascular volume, Drug and/or fluid administration and Frequent blood sampling  Procedure Details Consent: Risks of procedure as well as the alternatives and risks of each were explained to the (patient/caregiver).  Consent for procedure obtained. Time Out: Verified patient identification, verified procedure, site/side was marked, verified correct patient position, special equipment/implants available, medications/allergies/relevent history reviewed, required imaging and test results available.  Performed  Maximum sterile technique was used including antiseptics, cap, gloves, gown, hand hygiene, mask and sheet. Skin prep: Chlorhexidine; local anesthetic administered A antimicrobial bonded/coated triple lumen catheter was placed in the right internal jugular vein using the Seldinger technique.  Evaluation Blood flow good Complications: No apparent complications Patient did tolerate procedure well. Chest X-ray ordered to verify placement.  CXR: pending.  i perfromed US Shock Consented  Mcarthur RossettiDaniel J. Tyson AliasFeinstein, MD, FACP Pgr: [phone] [facility]

## 2014-12-14 NOTE — Clinical Documentation Improvement (Signed)
Platelets as below.  Please identify any clinical conditions associated with the abnormal platelet values, if any, and document in your progress note and carry over to the discharge summary.  Component      Platelets  Latest Ref Rng      150 - 400 K/uL  12/13/2014     1:37 PM 111 (L)  12/14/2014      88 (L)   Possible Clinical Conditions: -thrombocytopenia -other condition (please specify) -unable to determine at present  Thank you, Doy MinceVangela Jaleya Pebley, RN [phone] Clinical Documentation Specialist

## 2014-12-14 NOTE — Progress Notes (Signed)
eLink Physician-Brief Progress Note Patient Name: Albertha GheeBreanna Demond DOB: 04/14/1996 MRN: [id number]   Date of Service  12/14/2014  HPI/Events of Note  Hypokalemia Worsening no anion gap acidosis  eICU Interventions  K+ repleted IVFs changed from NS to LR + KCl     Intervention Category Major Interventions: Acid-Base disturbance - evaluation and management;Electrolyte abnormality - evaluation and management  Billy FischerDavid Simonds 12/14/2014, 5:40 AM

## 2014-12-14 NOTE — Progress Notes (Signed)
[facility] Progress Note    Subjective: Patient not intubated.  States that she feels somewhat better.  Still short of breath.   Objective: Vital signs in last 24 hours: Temp:  [98.3 F (36.8 C)-99.1 F (37.3 C)] 98.5 F (36.9 C) (02/24 0359) Pulse Rate:  [81-148] 81 (02/24 0700) Resp:  [18-50] 21 (02/24 0700) BP: (64-130)/(38-103) 90/55 mmHg (02/24 0700) SpO2:  [90 %-100 %] 93 % (02/24 0700) Weight:  [47.628 kg (105 lb)-55.5 kg (122 lb 5.7 oz)] 55.5 kg (122 lb 5.7 oz) (02/24 0142)    Intake/Output from previous day: 02/23 0701 - 02/24 0700 In: 5652.3 [I.V.:5489.8; IV Piggyback:162.5] Out: 440 [Urine:440] Intake/Output this shift:    General: No acute distress currently.  No distress.  Pain is improved  Lungs: Decreased excursion, no rales or rhonchi.  Decreased breath sounds in both bases.  CXR shows significant bibasilar atelectasis and effusions.  Concerned about pneumonia.  Abd: Soft, mildly distended, good bowel sounds.  Mild RUQ tenderness.  Extremities: No clinical signs or symptomsof DVT  Neuro: Intact  Lab Results:  @LABLAST2 (wbc:2,hgb:2,hct:2,plt:2) BMET  Recent Labs  12/13/14 1337 12/13/14 1835 12/14/14 0451  NA 131* 144 137  K 2.7* 4.3 3.1*  CL 103 105 114*  CO2 17*  --  14*  GLUCOSE 95 116* 94  BUN 16 19 15   CREATININE 1.21* 1.20* 1.09  CALCIUM 8.7  --  6.9*   PT/INR No results for input(s): LABPROT, INR in the last 72 hours. ABG No results for input(s): PHART, HCO3 in the last 72 hours.  Invalid input(s): PCO2, PO2  Studies/Results: [place] Abdomen Complete  12/13/2014   CLINICAL DATA:  Right upper quadrant pain, nausea  EXAM: ULTRASOUND ABDOMEN COMPLETE  COMPARISON:  None.  FINDINGS: Gallbladder: Gallbladder sludge and a gallstone is noted within gallbladder measures 1.1 cm. Mild thickening of gallbladder wall up to 3.4 mm. Although there is no sonographic Murphy's sign, early cholecystitis cannot be excluded. Clinical correlation is necessary.   Common bile duct: Diameter: 2.8 mm in diameter within normal limits.  Liver: No focal lesion identified. Within normal limits in parenchymal echogenicity.  IVC: No abnormality visualized.  Pancreas: Visualized portion unremarkable.  Spleen: Size and appearance within normal limits. Measures 10.2 cm in length. Accessory splenule measures 1.8 x 1.7 cm.  Right Kidney: Length: 11.9 cm. Echogenicity within normal limits. No mass or hydronephrosis visualized.  Left Kidney: Length: 11.7 cm. Echogenicity within normal limits. No mass or hydronephrosis visualized.  Abdominal aorta: No aneurysm visualized. Measures up to 1.6 cm in diameter.  Other findings: None.  IMPRESSION: 1. Gallbladder sludge and a gallstone is noted within gallbladder measures 1.1 cm. Mild thickening of gallbladder wall up to 3.4 mm. Although there is no sonographic Murphy's sign, early cholecystitis cannot be excluded. Clinical correlation is necessary. 2. Normal CBD. 3. No hydronephrosis.   Electronically Signed   By: Natasha MeadLiviu  Pop M.D.   On: 12/13/2014 17:44   [place] Ob Transvaginal  12/13/2014   CLINICAL DATA:  Evaluate dates.  On known last menstrual.  EXAM: LIMITED OBSTETRIC ULTRASOUND  FINDINGS: Number of Fetuses: 1  Heart Rate:  150 bpm  Movement: Yes  Presentation: Cephalic  Placental Location: Anterior  Previa: No  Amniotic Fluid (Subjective):  Normal  BPD:  3.3cm 16w  1d  MATERNAL FINDINGS:  Cervix:  Appears closed.  Uterus/Adnexae:  No abnormality visualized.  IMPRESSION: 1. Single living intrauterine gestation. The estimated gestational age is 16 weeks and 1 day.  This exam is performed  on an emergent basis and does not comprehensively evaluate fetal size, dating, or anatomy; follow-up complete OB US should be considered if further fetal assessment is warranted.   Electronically Signed   By: Signa Kell M.D.   On: 12/13/2014 19:02   Dg Chest Port 1 View  12/14/2014   CLINICAL DATA:  Septic shock. Central line placement. Sixteen weeks  pregnant.  EXAM: PORTABLE CHEST - 1 VIEW  COMPARISON:  None.  FINDINGS: Right central venous catheter. Tip is at the level of the cavoatrial junction. No pneumothorax. Normal heart size and pulmonary vascularity. Bilateral perihilar basilar infiltrates. Possible pleural effusions. No pneumothorax.  IMPRESSION: Right central venous catheter appears in satisfactory position. No pneumothorax. Basal perihilar infiltrates bilaterally with possible pleural effusions.   Electronically Signed   By: Burman Nieves M.D.   On: 12/14/2014 00:59    Anti-infectives: Anti-infectives    Start     Dose/Rate Route Frequency Ordered Stop   12/14/14 0100  piperacillin-tazobactam (ZOSYN) IVPB 3.375 g     3.375 g 12.5 mL/hr over 240 Minutes Intravenous Every 8 hours 12/13/14 2029     12/13/14 2030  piperacillin-tazobactam (ZOSYN) IVPB 3.375 g  Status:  Discontinued     3.375 g 100 mL/hr over 30 Minutes Intravenous  Once 12/13/14 2027 12/13/14 2029   12/13/14 1545  piperacillin-tazobactam (ZOSYN) IVPB 2.25 g     2.25 g 100 mL/hr over 30 Minutes Intravenous  Once 12/13/14 1539 12/13/14 2020      Assessment/Plan: s/p  Cholelithiasis with possible acute cholecystitis.  My concern is that none of her physical or radiological findings support severe cholangitis or acute cholecystitis.  For her to have a WBC > 20K and minimal abdominal symptoms would be very unusual for severe cholecystitis leading to sepsis.  She is still on pressors although her renal function appears to be improving.  I would not advocate cholecystectomy at this point.  A CT scan of the abdomen to look for other possible sources of infection could be useful, but the patient also has "dirty" urine.  Her CXR suggests possible pneumonia.    We will be available for surgery if this seems likely to improve her sepsis, but have to consider percutaneous drain if she is remains unstable and not a candidate for surgery. Discussed extensively with Dr.  Tyson Alias.  LOS: 1 day   Marta Lamas. Gae Bon, MD, FACS [phone] [facility] 12/14/2014

## 2014-12-15 ENCOUNTER — Inpatient Hospital Stay (HOSPITAL_COMMUNITY): Payer: Medicaid Other

## 2014-12-15 ENCOUNTER — Inpatient Hospital Stay (HOSPITAL_COMMUNITY): Payer: Medicaid Other | Admitting: Certified Registered Nurse Anesthetist

## 2014-12-15 ENCOUNTER — Encounter (HOSPITAL_COMMUNITY): Payer: Self-pay | Admitting: Certified Registered Nurse Anesthetist

## 2014-12-15 ENCOUNTER — Encounter (HOSPITAL_COMMUNITY)
Admission: EM | Disposition: A | Discharge status: Home / Self Care | Payer: Self-pay | Source: Home / Self Care | Attending: Internal Medicine

## 2014-12-15 DIAGNOSIS — O0381 Shock following complete or unspecified spontaneous abortion: Secondary | ICD-10-CM

## 2014-12-15 DIAGNOSIS — J810 Acute pulmonary edema: Secondary | ICD-10-CM

## 2014-12-15 DIAGNOSIS — Z8759 Personal history of other complications of pregnancy, childbirth and the puerperium: Secondary | ICD-10-CM

## 2014-12-15 DIAGNOSIS — K7200 Acute and subacute hepatic failure without coma: Secondary | ICD-10-CM

## 2014-12-15 DIAGNOSIS — O337 Maternal care for disproportion due to other fetal deformities: Secondary | ICD-10-CM

## 2014-12-15 DIAGNOSIS — R6521 Severe sepsis with septic shock: Secondary | ICD-10-CM

## 2014-12-15 HISTORY — DX: Personal history of other complications of pregnancy, childbirth and the puerperium: Z87.59

## 2014-12-15 LAB — CBC
HCT: 29.7 % — ABNORMAL LOW (ref 36.0–46.0)
HEMOGLOBIN: 10.3 g/dL — AB (ref 12.0–15.0)
MCH: 29.1 pg (ref 26.0–34.0)
MCHC: 34.7 g/dL (ref 30.0–36.0)
MCV: 83.9 fL (ref 78.0–100.0)
Platelets: 81 10*3/uL — ABNORMAL LOW (ref 150–400)
RBC: 3.54 MIL/uL — AB (ref 3.87–5.11)
RDW: 16.7 % — ABNORMAL HIGH (ref 11.5–15.5)
WBC: 16.5 10*3/uL — AB (ref 4.0–10.5)

## 2014-12-15 LAB — DIC (DISSEMINATED INTRAVASCULAR COAGULATION) PANEL
APTT: 30 s (ref 24–37)
INR: 0.99 (ref 0.00–1.49)
PLATELETS: 83 10*3/uL — AB (ref 150–400)
PROTHROMBIN TIME: 13.2 s (ref 11.6–15.2)

## 2014-12-15 LAB — DIC (DISSEMINATED INTRAVASCULAR COAGULATION)PANEL
D-Dimer, Quant: >20 ug/mL-FEU — ABNORMAL HIGH (ref 0.00–0.48)
Fibrinogen: 546 mg/dL — ABNORMAL HIGH (ref 204–475)
Smear Review: NONE SEEN

## 2014-12-15 SURGERY — DILATION AND EVACUATION, UTERUS
Anesthesia: General

## 2014-12-15 MED ORDER — LACTATED RINGERS IV SOLN: INTRAVENOUS | Status: DC

## 2014-12-15 MED ORDER — FENTANYL CITRATE 0.05 MG/ML IJ SOLN
INTRAMUSCULAR | Status: AC
  Filled 2014-12-15: qty 5

## 2014-12-15 MED ORDER — MISOPROSTOL 200 MCG PO TABS
800.0000 ug | ORAL_TABLET | Freq: Once | ORAL | Status: AC
  Administered 2014-12-15: 800 ug via RECTAL
  Filled 2014-12-15: qty 4

## 2014-12-15 MED ORDER — OXYTOCIN 10 UNIT/ML IJ SOLN
40.0000 [IU] | INTRAVENOUS | Status: DC
  Administered 2014-12-15: 40 [IU] via INTRAVENOUS
  Filled 2014-12-15: qty 4

## 2014-12-15 MED ORDER — FENTANYL CITRATE 0.05 MG/ML IJ SOLN
INTRAMUSCULAR | Status: AC
  Filled 2014-12-15: qty 2

## 2014-12-15 MED ORDER — FENTANYL CITRATE 0.05 MG/ML IJ SOLN
100.0000 ug | Freq: Once | INTRAMUSCULAR | Status: AC
  Administered 2014-12-15: 100 ug via INTRAVENOUS

## 2014-12-15 MED ORDER — WHITE PETROLATUM GEL
Status: AC
  Administered 2014-12-15: 0.2
  Filled 2014-12-15: qty 1

## 2014-12-15 MED ORDER — OXYTOCIN 10 UNIT/ML IJ SOLN
40.0000 [IU] | INTRAVENOUS | Status: DC
  Filled 2014-12-15: qty 4

## 2014-12-15 MED ORDER — OXYTOCIN 10 UNIT/ML IJ SOLN
10.0000 [IU] | Freq: Once | INTRAMUSCULAR | Status: DC
  Filled 2014-12-15: qty 1

## 2014-12-15 MED ORDER — ONDANSETRON HCL 4 MG/2ML IJ SOLN
INTRAMUSCULAR | Status: AC
  Filled 2014-12-15: qty 2

## 2014-12-15 MED ORDER — OXYCODONE-ACETAMINOPHEN 5-325 MG PO TABS
1.0000 | ORAL_TABLET | Freq: Four times a day (QID) | ORAL | Status: DC | PRN
  Administered 2014-12-15 – 2014-12-17 (×2): 1 via ORAL
  Filled 2014-12-15 (×2): qty 1

## 2014-12-15 MED ORDER — LIDOCAINE HCL (CARDIAC) 20 MG/ML IV SOLN
INTRAVENOUS | Status: AC
  Filled 2014-12-15: qty 5

## 2014-12-15 MED ORDER — POTASSIUM CHLORIDE CRYS ER 20 MEQ PO TBCR
40.0000 meq | EXTENDED_RELEASE_TABLET | Freq: Three times a day (TID) | ORAL | Status: AC
  Administered 2014-12-15 (×2): 40 meq via ORAL
  Filled 2014-12-15 (×2): qty 2

## 2014-12-15 NOTE — Anesthesia Preprocedure Evaluation (Signed)
Anesthesia Evaluation  Patient identified by MRN, date of birth, ID band Patient awake    Reviewed: Allergy & Precautions, NPO status , Patient's Chart, lab work & pertinent test results  Airway        Dental  (+) Dental Advisory Given   Pulmonary          Cardiovascular     Neuro/Psych    GI/Hepatic   Endo/Other    Renal/GU      Musculoskeletal   Abdominal   Peds  Hematology   Anesthesia Other Findings   Reproductive/Obstetrics                             Anesthesia Physical Anesthesia Plan  ASA: II and emergent  Anesthesia Plan: General   Post-op Pain Management:    Induction: Intravenous  Airway Management Planned: Oral ETT  Additional Equipment:   Intra-op Plan:   Post-operative Plan: Extubation in OR  Informed Consent: I have reviewed the patients History and Physical, chart, labs and discussed the procedure including the risks, benefits and alternatives for the proposed anesthesia with the patient or authorized representative who has indicated his/her understanding and acceptance.   Dental advisory given  Plan Discussed with: CRNA, Anesthesiologist and Surgeon  Anesthesia Plan Comments:         Anesthesia Quick Evaluation

## 2014-12-15 NOTE — Progress Notes (Signed)
Patient assisted to bedside commode from bed. Upon patient standing to return to bed, a non-viable fetus with cord still intact was observed, with some blood noted in the commode. Pt reported to be without pain or cramps, and was assisted back to bed. Dr. Molli KnockYacoub notified. Dr. Adrian BlackwaterStinson arrived at bedside to assist with remaining delivery.   Barbaraann FasterJennifer Moani Weipert RN

## 2014-12-15 NOTE — Procedures (Signed)
Albertha GheeBreanna Kiesling [id number] 07/21/1996  Procedure: Vaginal Delivery Note Preprocedure Dx: Incomplete Miscarriage, Septic Shock Postprocedure diagnoses   On 12/15/14 at 8:00am, the patient delivered the body of a non-viable female fetus. When I arrived, the fetus was at the perineum.  At 10am, pitocin 10units was injected into the cord and at 10:20am 800mcg of cytotec was placed vaginally.  Approximately 1 hour later, the patient passed the fetal head.  Due to several episodes of bleeding and the inability to see the edge of the placenta on speculum exam, the decision was made to take the patient to the OR for dilation and evacuation.  However, before the patient was taken to the OR, the patient passed several large clots.  A speculum exam was done, revealing part of the placenta in the vaginal vault.  This was grasped and teased out with ring forceps.  Delivery of the placenta occurred at 2:17pm.  Examination of the perineum reveals that it was intact.  US will be done to assure no retained products.  EBL: 600mL  Levie HeritageJacob J Mikell Kazlauskas, DO 12/15/2014 2:48 PM

## 2014-12-15 NOTE — Progress Notes (Signed)
NUTRITION BRIEF NOTE  Pt identified due to low BMI  Wt Readings from Last 10 Encounters:  12/15/14 110 lb 7.2 oz (50.1 kg)   Body mass index is 18.42 kg/(m^2). Pt meets criteria for Underweight based on current BMI.   Current diet order is Clear Liquids. Labs and medications reviewed.  Pt admitted for septic shock with incomplete spontaneous abortion.  Pt denied any wt loss or decreased appetite pta.  NFPE showed no muscle mass or subcutaneous body fat depletion.  No nutrition interventions warranted at this time. If nutrition issues arise, please consult RD.  Jeanette FeltElisa Kalei Meda, MS Dietetic Intern Pager: [phone]

## 2014-12-15 NOTE — Consult Note (Signed)
[facility] OB/GYN Attending Consult Note   Consult Date: 12/15/2014  Reason for Consult: Spontaneous Abortion by septic shock Referring Physician: Dr Nelda Marseille, ICU physician   Jeanette Copeland is an 19 y.o. G1P0 female who was admitted for  Impression/Plan: 1.  Septic shock 2. Incomplete Spontaneous Abortion in Septic patient 3.  G1 4.  IUP at 65w3d5.  UTI 6. Liver Failure - resolved 7. ARI - resolved   Pitocin 40units in 1L LR at 1287mper hour started.  Examination of the fetus revealed the body and 4 extremities with a missing head.  Will expectantly manage the retained placenta and give enough time to allow this to come naturally.  10 units of pitocin injected into the umbilical cord and the patient was given cytotec 80083mrectally.    Appreciate care of [name] by her primary team Will continue daily rounding and daily FHR assessment.  Please call [phone] for any obstetric concerns at any time.  Thank you for involving us [place] the care of this patient.    HPI: This is a [age]yoP0 at 16w77w3dsecond trimester US tKoreat was preformed on 12/13/14.  Patient was unaware that she was pregnant.  [facility] and [facility] after 4 days of non-radiating, sharp RUQ pain.  She was found to be in septic shock.  She was started on Zosyn and pressors and admitted to the ICU.  Etiology of the patient's sepsis is acute cholecystitis/cholangitis vs urinary source.  The primary team contacted us a[place]ore[place]he passed the fetus, with the cord and placenta retained.  Pertinent OB/GYN History: No LMP recorded. Patient is pregnant.  Patient Active Problem List   Diagnosis Date Noted  . Spontaneous abortion complicated by shock 02/252/84/1324Encounter for central line placement   . Hypokalemia   . RUQ pain   . Pulmonary edema   . Septic shock 12/13/2014    Past Medical History  Diagnosis Date  . Medical  history non-contributory     Past Surgical History  Procedure Laterality Date  . No past surgeries      No family history on file.  Social History:  reports that she has never smoked. She does not have any smokeless tobacco history on file. She reports that she does not drink alcohol or use illicit drugs.  Allergies: No Known Allergies  Medications: I have reviewed the patient's current medications.  Review of Systems: Pertinent items are noted in HPI.  Focused Physical Examination BP 97/59 mmHg  Pulse 90  Temp(Src) 97.3 F (36.3 C) (Oral)  Resp 20  Ht 5' 5"  (1.651 m)  Wt 110 lb 7.2 oz (50.1 kg)  BMI 18.38 kg/m2  SpO2 94%  LMP  GENERAL: Well-developed, well-nourished female in no acute distress.  ABDOMEN: Soft, nontender, nondistended. No organomegaly. PELVIC: Normal external female genitalia. Vagina is pink and rugated. Uterus at 17 week size. No adnexal mass or tenderness.  EXTREMITIES: No cyanosis, clubbing, or edema, 2+ distal pulses.  Results for orders placed or performed during the hospital encounter of 12/13/14 (from the past 72 hour(s))  GC/Chlamydia probe amp ([place])     Status: None   Collection Time: 12/13/14 12:00 AM  Result Value Ref Range   Chlamydia CT: Negative     Comment: Normal Reference Range - Negative   Neisseria gonorrhea NG: Negative  Comment: Normal Reference Range - Negative  CBC with Differential     Status: Abnormal   Collection Time: 12/13/14  1:37 PM  Result Value Ref Range   WBC 10.2 4.0 - 10.5 K/uL   RBC 3.93 3.87 - 5.11 MIL/uL   Hemoglobin 11.5 (L) 12.0 - 15.0 g/dL   HCT 33.0 (L) 36.0 - 46.0 %   MCV 84.0 78.0 - 100.0 fL   MCH 29.3 26.0 - 34.0 pg   MCHC 34.8 30.0 - 36.0 g/dL   RDW 15.2 11.5 - 15.5 %   Platelets 111 (L) 150 - 400 K/uL    Comment: REPEATED TO VERIFY PLATELET COUNT CONFIRMED BY SMEAR    Neutrophils Relative % 96 (H) 43 - 77 %   Lymphocytes Relative 3 (L) 12 - 46 %   Monocytes Relative 1 (L) 3 - 12 %    Eosinophils Relative 0 0 - 5 %   Basophils Relative 0 0 - 1 %   Neutro Abs 9.8 (H) 1.7 - 7.7 K/uL   Lymphs Abs 0.3 (L) 0.7 - 4.0 K/uL   Monocytes Absolute 0.1 0.1 - 1.0 K/uL   Eosinophils Absolute 0.0 0.0 - 0.7 K/uL   Basophils Absolute 0.0 0.0 - 0.1 K/uL   RBC Morphology POLYCHROMASIA PRESENT     Comment: ELLIPTOCYTES   WBC Morphology TOXIC GRANULATION     Comment: DOHLE BODIES VACUOLATED NEUTROPHILS INCREASED BANDS (>20% BANDS)   Comprehensive metabolic panel     Status: Abnormal   Collection Time: 12/13/14  1:37 PM  Result Value Ref Range   Sodium 131 (L) 135 - 145 mmol/L   Potassium 2.7 (LL) 3.5 - 5.1 mmol/L    Comment: CRITICAL RESULT CALLED TO, READ BACK BY AND VERIFIED WITH: GROSE J RN 12/13/14 1428 COSTELLO B REPEATED TO VERIFY    Chloride 103 96 - 112 mmol/L   CO2 17 (L) 19 - 32 mmol/L   Glucose, Bld 95 70 - 99 mg/dL   BUN 16 6 - 23 mg/dL   Creatinine, Ser 1.21 (H) 0.50 - 1.10 mg/dL   Calcium 8.7 8.4 - 10.5 mg/dL   Total Protein 5.9 (L) 6.0 - 8.3 g/dL   Albumin 2.6 (L) 3.5 - 5.2 g/dL   AST 92 (H) 0 - 37 U/L   ALT 49 (H) 0 - 35 U/L   Alkaline Phosphatase 535 (H) 39 - 117 U/L   Total Bilirubin 2.0 (H) 0.3 - 1.2 mg/dL   GFR calc non Af Amer 64 (L) >90 mL/min   GFR calc Af Amer 74 (L) >90 mL/min    Comment: (NOTE) The eGFR has been calculated using the CKD EPI equation. This calculation has not been validated in all clinical situations. eGFR's persistently <90 mL/min signify possible Chronic Kidney Disease.    Anion gap 11 5 - 15  Lipase, blood     Status: None   Collection Time: 12/13/14  1:37 PM  Result Value Ref Range   Lipase 20 11 - 59 U/L  I-Stat Beta hCG blood, ED ([facility], [facility], [facility] only)     Status: Abnormal   Collection Time: 12/13/14  3:33 PM  Result Value Ref Range   I-stat hCG, quantitative >2000.0 (H) <5 mIU/mL   Comment 3            Comment:   GEST. AGE      CONC.  (mIU/mL)   <=1 WEEK        5 - 50     2  WEEKS       50 - 500     3 WEEKS       100 -  10,000     4 WEEKS     1,000 - 30,000        FEMALE AND NON-PREGNANT FEMALE:     LESS THAN 5 mIU/mL   I-Stat CG4 Lactic Acid, ED     Status: Abnormal   Collection Time: 12/13/14  3:35 PM  Result Value Ref Range   Lactic Acid, Venous 5.26 (HH) 0.5 - 2.0 mmol/L   Comment NOTIFIED PHYSICIAN   Wet prep, genital     Status: Abnormal   Collection Time: 12/13/14  4:51 PM  Result Value Ref Range   Yeast Wet Prep HPF POC FEW (A) NONE SEEN   Trich, Wet Prep NONE SEEN NONE SEEN   Clue Cells Wet Prep HPF POC NONE SEEN NONE SEEN   WBC, Wet Prep HPF POC FEW (A) NONE SEEN  Rh IG workup (includes ABO/Rh)     Status: None   Collection Time: 12/13/14  6:13 PM  Result Value Ref Range   Gestational Age(Wks) 10    ABO/RH(D) A POS    No rh immune globuloin NOT A RH IMMUNE GLOBULIN CANDIDATE, PT RH POSITIVE   RPR     Status: None   Collection Time: 12/13/14  6:13 PM  Result Value Ref Range   RPR Ser Ql Non Reactive Non Reactive    Comment: (NOTE) Performed At: [facility] [place], [place] [id number] Lindon Romp MD WU:[id number]   HIV antibody     Status: None   Collection Time: 12/13/14  6:13 PM  Result Value Ref Range   HIV Screen 4th Generation wRfx Non Reactive Non Reactive    Comment: (NOTE) Performed At: [facility] [place], [place] [id number] Lindon Romp MD VH:[id number]   I-Stat CG4 Lactic Acid, ED     Status: Abnormal   Collection Time: 12/13/14  6:35 PM  Result Value Ref Range   Lactic Acid, Venous 3.23 (HH) 0.5 - 2.0 mmol/L   Comment NOTIFIED PHYSICIAN   I-stat chem 8, ed     Status: Abnormal   Collection Time: 12/13/14  6:35 PM  Result Value Ref Range   Sodium 144 135 - 145 mmol/L   Potassium 4.3 3.5 - 5.1 mmol/L   Chloride 105 96 - 112 mmol/L   BUN 19 6 - 23 mg/dL   Creatinine, Ser 1.20 (H) 0.50 - 1.10 mg/dL   Glucose, Bld 116 (H) 70 - 99 mg/dL   Calcium, Ion 1.15 1.12 - 1.23 mmol/L   TCO2 25 0 - 100 mmol/L    Hemoglobin 16.0 (H) 12.0 - 15.0 g/dL   HCT 47.0 (H) 36.0 - 46.0 %  Urinalysis, Routine w reflex microscopic     Status: Abnormal   Collection Time: 12/13/14  8:57 PM  Result Value Ref Range   Color, Urine AMBER (A) YELLOW    Comment: BIOCHEMICALS MAY BE AFFECTED BY COLOR   APPearance TURBID (A) CLEAR   Specific Gravity, Urine 1.017 1.005 - 1.030   pH 5.5 5.0 - 8.0   Glucose, UA NEGATIVE NEGATIVE mg/dL   Hgb urine dipstick SMALL (A) NEGATIVE   Bilirubin Urine MODERATE (A) NEGATIVE   Ketones, ur 15 (A) NEGATIVE mg/dL   Protein, ur 100 (A) NEGATIVE mg/dL   Urobilinogen, UA 1.0 0.0 - 1.0 mg/dL   Nitrite POSITIVE (A) NEGATIVE  Leukocytes, UA LARGE (A) NEGATIVE  Urine culture     Status: None   Collection Time: 12/13/14  8:57 PM  Result Value Ref Range   Specimen Description URINE, CLEAN CATCH    Special Requests NONE    Colony Count      >=100,000 COLONIES/ML Performed at [facility]    Culture      Multiple bacterial morphotypes present, none predominant. Suggest appropriate recollection if clinically indicated. Performed at [facility]    Report Status 12/14/2014 FINAL   Urine rapid drug screen (hosp performed)     Status: None   Collection Time: 12/13/14  8:57 PM  Result Value Ref Range   Opiates NONE DETECTED NONE DETECTED   Cocaine NONE DETECTED NONE DETECTED   Benzodiazepines NONE DETECTED NONE DETECTED   Amphetamines NONE DETECTED NONE DETECTED   Tetrahydrocannabinol NONE DETECTED NONE DETECTED   Barbiturates NONE DETECTED NONE DETECTED    Comment:        DRUG SCREEN FOR MEDICAL PURPOSES ONLY.  IF CONFIRMATION IS NEEDED FOR ANY PURPOSE, NOTIFY LAB WITHIN 5 DAYS.        LOWEST DETECTABLE LIMITS FOR URINE DRUG SCREEN Drug Class       Cutoff (ng/mL) Amphetamine      1000 Barbiturate      200 Benzodiazepine   166 Tricyclics       063 Opiates          300 Cocaine          300 THC              50   Urine microscopic-add on     Status: Abnormal     Collection Time: 12/13/14  8:57 PM  Result Value Ref Range   Squamous Epithelial / LPF MANY (A) RARE   WBC, UA 21-50 <3 WBC/hpf   RBC / HPF 0-2 <3 RBC/hpf   Bacteria, UA MANY (A) RARE   Casts GRANULAR CAST (A) NEGATIVE    Comment: HYALINE CASTS   Urine-Other FEW YEAST   MRSA PCR Screening     Status: None   Collection Time: 12/13/14 11:00 PM  Result Value Ref Range   MRSA by PCR NEGATIVE NEGATIVE    Comment:        The GeneXpert MRSA Assay (FDA approved for NASAL specimens only), is one component of a comprehensive MRSA colonization surveillance program. It is not intended to diagnose MRSA infection nor to guide or monitor treatment for MRSA infections.   Cortisol     Status: None   Collection Time: 12/14/14 12:30 AM  Result Value Ref Range   Cortisol, Plasma 36.3 ug/dL    Comment: (NOTE) AM:  4.3 - 22.4 ug/dL PM:  3.1 - 16.7 ug/dL Performed at [facility]   TSH     Status: None   Collection Time: 12/14/14 12:30 AM  Result Value Ref Range   TSH 1.782 0.350 - 4.500 uIU/mL  Lipase, blood     Status: None   Collection Time: 12/14/14 12:30 AM  Result Value Ref Range   Lipase 18 11 - 59 U/L  Amylase     Status: None   Collection Time: 12/14/14 12:30 AM  Result Value Ref Range   Amylase 23 0 - 105 U/L  Glucose, capillary     Status: Abnormal   Collection Time: 12/14/14 12:38 AM  Result Value Ref Range   Glucose-Capillary 108 (H) 70 - 99 mg/dL   Comment 1 Notify RN  Carboxyhemoglobin     Status: Abnormal   Collection Time: 12/14/14  1:22 AM  Result Value Ref Range   Total hemoglobin 10.5 (L) 12.0 - 16.0 g/dL   O2 Saturation 70.2 %   Carboxyhemoglobin 0.8 0.5 - 1.5 %   Methemoglobin 0.8 0.0 - 1.5 %  Urinalysis, Routine w reflex microscopic     Status: Abnormal   Collection Time: 12/14/14  2:44 AM  Result Value Ref Range   Color, Urine YELLOW YELLOW   APPearance CLEAR CLEAR   Specific Gravity, Urine 1.011 1.005 - 1.030   pH 6.0 5.0 - 8.0   Glucose,  UA NEGATIVE NEGATIVE mg/dL   Hgb urine dipstick TRACE (A) NEGATIVE   Bilirubin Urine NEGATIVE NEGATIVE   Ketones, ur NEGATIVE NEGATIVE mg/dL   Protein, ur 30 (A) NEGATIVE mg/dL   Urobilinogen, UA 1.0 0.0 - 1.0 mg/dL   Nitrite NEGATIVE NEGATIVE   Leukocytes, UA MODERATE (A) NEGATIVE  Urine microscopic-add on     Status: Abnormal   Collection Time: 12/14/14  2:44 AM  Result Value Ref Range   Squamous Epithelial / LPF FEW (A) RARE   WBC, UA 11-20 <3 WBC/hpf   RBC / HPF 0-2 <3 RBC/hpf   Bacteria, UA FEW (A) RARE  CBC     Status: Abnormal   Collection Time: 12/14/14  4:51 AM  Result Value Ref Range   WBC 23.2 (H) 4.0 - 10.5 K/uL   RBC 3.45 (L) 3.87 - 5.11 MIL/uL   Hemoglobin 10.0 (L) 12.0 - 15.0 g/dL    Comment: REPEATED TO VERIFY   HCT 28.4 (L) 36.0 - 46.0 %   MCV 82.3 78.0 - 100.0 fL   MCH 29.0 26.0 - 34.0 pg   MCHC 35.2 30.0 - 36.0 g/dL   RDW 15.8 (H) 11.5 - 15.5 %   Platelets 88 (L) 150 - 400 K/uL    Comment: REPEATED TO VERIFY CONSISTENT WITH PREVIOUS RESULT   Magnesium     Status: Abnormal   Collection Time: 12/14/14  4:51 AM  Result Value Ref Range   Magnesium 1.3 (L) 1.5 - 2.5 mg/dL  Phosphorus     Status: Abnormal   Collection Time: 12/14/14  4:51 AM  Result Value Ref Range   Phosphorus 2.1 (L) 2.3 - 4.6 mg/dL  Comprehensive metabolic panel     Status: Abnormal   Collection Time: 12/14/14  4:51 AM  Result Value Ref Range   Sodium 137 135 - 145 mmol/L    Comment: DELTA CHECK NOTED   Potassium 3.1 (L) 3.5 - 5.1 mmol/L    Comment: DELTA CHECK NOTED   Chloride 114 (H) 96 - 112 mmol/L   CO2 14 (L) 19 - 32 mmol/L   Glucose, Bld 94 70 - 99 mg/dL   BUN 15 6 - 23 mg/dL   Creatinine, Ser 1.09 0.50 - 1.10 mg/dL   Calcium 6.9 (L) 8.4 - 10.5 mg/dL   Total Protein 4.6 (L) 6.0 - 8.3 g/dL   Albumin 1.9 (L) 3.5 - 5.2 g/dL   AST 87 (H) 0 - 37 U/L   ALT 47 (H) 0 - 35 U/L   Alkaline Phosphatase 130 (H) 39 - 117 U/L   Total Bilirubin 1.0 0.3 - 1.2 mg/dL   GFR calc non Af Amer  73 (L) >90 mL/min   GFR calc Af Amer 84 (L) >90 mL/min    Comment: (NOTE) The eGFR has been calculated using the CKD EPI equation. This calculation has not been validated  in all clinical situations. eGFR's persistently <90 mL/min signify possible Chronic Kidney Disease.    Anion gap 9 5 - 15  Basic metabolic panel     Status: Abnormal   Collection Time: 12/14/14  2:00 PM  Result Value Ref Range   Sodium 141 135 - 145 mmol/L   Potassium 3.5 3.5 - 5.1 mmol/L   Chloride 117 (H) 96 - 112 mmol/L   CO2 17 (L) 19 - 32 mmol/L   Glucose, Bld 84 70 - 99 mg/dL   BUN 13 6 - 23 mg/dL   Creatinine, Ser 0.88 0.50 - 1.10 mg/dL   Calcium 7.4 (L) 8.4 - 10.5 mg/dL   GFR calc non Af Amer >90 >90 mL/min   GFR calc Af Amer >90 >90 mL/min    Comment: (NOTE) The eGFR has been calculated using the CKD EPI equation. This calculation has not been validated in all clinical situations. eGFR's persistently <90 mL/min signify possible Chronic Kidney Disease.    Anion gap 7 5 - 15  Lactic acid, plasma     Status: None   Collection Time: 12/14/14  2:00 PM  Result Value Ref Range   Lactic Acid, Venous 0.8 0.5 - 2.0 mmol/L  Lactic acid, plasma     Status: None   Collection Time: 12/14/14  5:10 PM  Result Value Ref Range   Lactic Acid, Venous 0.8 0.5 - 2.0 mmol/L     US Abdomen Complete  12/13/2014   CLINICAL DATA:  Right upper quadrant pain, nausea  EXAM: ULTRASOUND ABDOMEN COMPLETE  COMPARISON:  None.  FINDINGS: Gallbladder: Gallbladder sludge and a gallstone is noted within gallbladder measures 1.1 cm. Mild thickening of gallbladder wall up to 3.4 mm. Although there is no sonographic Murphy's sign, early cholecystitis cannot be excluded. Clinical correlation is necessary.  Common bile duct: Diameter: 2.8 mm in diameter within normal limits.  Liver: No focal lesion identified. Within normal limits in parenchymal echogenicity.  IVC: No abnormality visualized.  Pancreas: Visualized portion unremarkable.   Spleen: Size and appearance within normal limits. Measures 10.2 cm in length. Accessory splenule measures 1.8 x 1.7 cm.  Right Kidney: Length: 11.9 cm. Echogenicity within normal limits. No mass or hydronephrosis visualized.  Left Kidney: Length: 11.7 cm. Echogenicity within normal limits. No mass or hydronephrosis visualized.  Abdominal aorta: No aneurysm visualized. Measures up to 1.6 cm in diameter.  Other findings: None.  IMPRESSION: 1. Gallbladder sludge and a gallstone is noted within gallbladder measures 1.1 cm. Mild thickening of gallbladder wall up to 3.4 mm. Although there is no sonographic Murphy's sign, early cholecystitis cannot be excluded. Clinical correlation is necessary. 2. Normal CBD. 3. No hydronephrosis.   Electronically Signed   By: Lahoma Crocker M.D.   On: 12/13/2014 17:44   US Ob Transvaginal  12/13/2014   CLINICAL DATA:  Evaluate dates.  On known last menstrual.  EXAM: LIMITED OBSTETRIC ULTRASOUND  FINDINGS: Number of Fetuses: 1  Heart Rate:  150 bpm  Movement: Yes  Presentation: Cephalic  Placental Location: Anterior  Previa: No  Amniotic Fluid (Subjective):  Normal  BPD:  3.3cm 16w  1d  MATERNAL FINDINGS:  Cervix:  Appears closed.  Uterus/Adnexae:  No abnormality visualized.  IMPRESSION: 1. Single living intrauterine gestation. The estimated gestational age is 49 weeks and 1 day.  This exam is performed on an emergent basis and does not comprehensively evaluate fetal size, dating, or anatomy; follow-up complete OB US should be considered if further fetal assessment is warranted.  Electronically Signed   By: Kerby Moors M.D.   On: 12/13/2014 19:02   Dg Chest Port 1 View  12/15/2014   CLINICAL DATA:  Acute pulmonary edema, septic shock.  EXAM: PORTABLE CHEST - 1 VIEW  COMPARISON:  Portable chest x-ray of December 14, 2014  FINDINGS: The lung volumes remain low. Moderate-sized pleural effusions persist layering posteriorly. The pulmonary interstitial markings remain increased. The  pulmonary vascularity remains engorged. The cardiac silhouette is largely obscured. The mediastinum is normal in width. The right internal jugular venous catheter tip projects over the distal portion of the SVC.  IMPRESSION: Persistent bilateral pleural effusions and bilateral pulmonary interstitial edema consistent with CHF. There has been slight interval deterioration since yesterday's study.   Electronically Signed   By: David  Martinique   On: 12/15/2014 07:45   Dg Chest Port 1 View  12/14/2014   CLINICAL DATA:  Septic shock. Central line placement. Sixteen weeks pregnant.  EXAM: PORTABLE CHEST - 1 VIEW  COMPARISON:  None.  FINDINGS: Right central venous catheter. Tip is at the level of the cavoatrial junction. No pneumothorax. Normal heart size and pulmonary vascularity. Bilateral perihilar basilar infiltrates. Possible pleural effusions. No pneumothorax.  IMPRESSION: Right central venous catheter appears in satisfactory position. No pneumothorax. Basal perihilar infiltrates bilaterally with possible pleural effusions.   Electronically Signed   By: Lucienne Capers M.D.   On: 12/14/2014 00:59     Truett Mainland, DO 12/15/2014, 8:57 AM Attending Physician Faculty Practice, [facility]

## 2014-12-15 NOTE — Progress Notes (Signed)
Pt has had a moderate bleeding episode with sticky mucus from her vagina. Concern for miscarriage. Dr. Sung AmabileSimonds made aware.

## 2014-12-15 NOTE — H&P (Signed)
PULMONARY / CRITICAL CARE MEDICINE   Name: Jeanette Copeland MRN: [id number] DOB: 29-Apr-1996    ADMISSION DATE:  12/13/2014 CONSULTATION DATE:  12/13/2014  REFERRING MD :  [facility]  CHIEF COMPLAINT:  Septic shock  INITIAL PRESENTATION: Patient presented to the ED with vomiting and chills. She was found to be hypotensive, requiring 5L bolus of NS, to keep systolic pressures in 27X. Found to be pregnant on urine pregnancy with confirmation on ultrasound.  STUDIES:  Abdominal US: sludge with concern for cholecystitis Transvaginal US: Single IUP at 16w 1d  SIGNIFICANT EVENTS: 2/23: Patient admitted 2/25: Tissue passing in bedside commode from the vaginal vault.  REVIEW OF SYSTEMS:   ROS    SUBJECTIVE: Off neo x2 hours.  BP marginal but viable.  Tissue passing and now in the vaginal vault.  VITAL SIGNS: Temp:  [97.3 F (36.3 C)-99 F (37.2 C)] 97.3 F (36.3 C) (02/25 0742) Pulse Rate:  [73-106] 90 (02/25 0730) Resp:  [17-40] 20 (02/25 0730) BP: (78-111)/(40-78) 97/59 mmHg (02/25 0730) SpO2:  [90 %-98 %] 94 % (02/25 0600) Weight:  [50.1 kg (110 lb 7.2 oz)] 50.1 kg (110 lb 7.2 oz) (02/25 0600)   HEMODYNAMICS: CVP:  [8 mmHg] 8 mmHg   VENTILATOR SETTINGS:   INTAKE / OUTPUT:  Intake/Output Summary (Last 24 hours) at 12/15/14 0844 Last data filed at 12/15/14 0700  Gross per 24 hour  Intake  749.1 ml  Output   1325 ml  Net -575.9 ml   PHYSICAL EXAMINATION: General:  Well appearing female, no distress Neuro:  Alert, oriented, moves extremities spontaneously. NAD. HEENT:  [place]/AT, PERRL, EOM-I and MMM. Cardiovascular:  Nl S1/S2 Regular rate and rhythm, no murmurs Lungs:  CTA bilaterally. Abdomen:  Soft, RUQ tenderness resolved, ND and +BS. Musculoskeletal: No edema Skin:  No rashes, intact  LABS:  CBC  Recent Labs Lab 12/13/14 1337 12/13/14 1835 12/14/14 0451  WBC 10.2  --  23.2*  HGB 11.5* 16.0* 10.0*  HCT 33.0* 47.0* 28.4*  PLT 111*  --  88*   Coag's No  results for input(s): APTT, INR in the last 168 hours. BMET  Recent Labs Lab 12/13/14 1337 12/13/14 1835 12/14/14 0451 12/14/14 1400  NA 131* 144 137 141  K 2.7* 4.3 3.1* 3.5  CL 103 105 114* 117*  CO2 17*  --  14* 17*  BUN _0 CREATININE 1.21* 1.20* 1.09 0.88  GLUCOSE 95 116* 94 84   Electrolytes  Recent Labs Lab 12/13/14 1337 12/14/14 0451 12/14/14 1400  CALCIUM 8.7 6.9* 7.4*  MG  --  1.3*  --   PHOS  --  2.1*  --    Sepsis Markers  Recent Labs Lab 12/13/14 1835 12/14/14 1400 12/14/14 1710  LATICACIDVEN 3.23* 0.8 0.8   ABG No results for input(s): PHART, PCO2ART, PO2ART in the last 168 hours. Liver Enzymes  Recent Labs Lab 12/13/14 1337 12/14/14 0451  AST 92* 87*  ALT 49* 47*  ALKPHOS 535* 130*  BILITOT 2.0* 1.0  ALBUMIN 2.6* 1.9*   Cardiac Enzymes No results for input(s): TROPONINI, PROBNP in the last 168 hours. Glucose  Recent Labs Lab 12/14/14 0038  GLUCAP 108*    Imaging US Abdomen Complete  12/13/2014   CLINICAL DATA:  Right upper quadrant pain, nausea  EXAM: ULTRASOUND ABDOMEN COMPLETE  COMPARISON:  None.  FINDINGS: Gallbladder: Gallbladder sludge and a gallstone is noted within gallbladder measures 1.1 cm. Mild thickening of gallbladder wall up to 3.4 mm.  Although there is no sonographic Murphy's sign, early cholecystitis cannot be excluded. Clinical correlation is necessary.  Common bile duct: Diameter: 2.8 mm in diameter within normal limits.  Liver: No focal lesion identified. Within normal limits in parenchymal echogenicity.  IVC: No abnormality visualized.  Pancreas: Visualized portion unremarkable.  Spleen: Size and appearance within normal limits. Measures 10.2 cm in length. Accessory splenule measures 1.8 x 1.7 cm.  Right Kidney: Length: 11.9 cm. Echogenicity within normal limits. No mass or hydronephrosis visualized.  Left Kidney: Length: 11.7 cm. Echogenicity within normal limits. No mass or hydronephrosis visualized.   Abdominal aorta: No aneurysm visualized. Measures up to 1.6 cm in diameter.  Other findings: None.  IMPRESSION: 1. Gallbladder sludge and a gallstone is noted within gallbladder measures 1.1 cm. Mild thickening of gallbladder wall up to 3.4 mm. Although there is no sonographic Murphy's sign, early cholecystitis cannot be excluded. Clinical correlation is necessary. 2. Normal CBD. 3. No hydronephrosis.   Electronically Signed   By: Lahoma Crocker M.D.   On: 12/13/2014 17:44   US Ob Transvaginal  12/13/2014   CLINICAL DATA:  Evaluate dates.  On known last menstrual.  EXAM: LIMITED OBSTETRIC ULTRASOUND  FINDINGS: Number of Fetuses: 1  Heart Rate:  150 bpm  Movement: Yes  Presentation: Cephalic  Placental Location: Anterior  Previa: No  Amniotic Fluid (Subjective):  Normal  BPD:  3.3cm 16w  1d  MATERNAL FINDINGS:  Cervix:  Appears closed.  Uterus/Adnexae:  No abnormality visualized.  IMPRESSION: 1. Single living intrauterine gestation. The estimated gestational age is 73 weeks and 1 day.  This exam is performed on an emergent basis and does not comprehensively evaluate fetal size, dating, or anatomy; follow-up complete OB US should be considered if further fetal assessment is warranted.   Electronically Signed   By: Kerby Moors M.D.   On: 12/13/2014 19:02   Dg Chest Port 1 View  12/15/2014   CLINICAL DATA:  Acute pulmonary edema, septic shock.  EXAM: PORTABLE CHEST - 1 VIEW  COMPARISON:  Portable chest x-ray of December 14, 2014  FINDINGS: The lung volumes remain low. Moderate-sized pleural effusions persist layering posteriorly. The pulmonary interstitial markings remain increased. The pulmonary vascularity remains engorged. The cardiac silhouette is largely obscured. The mediastinum is normal in width. The right internal jugular venous catheter tip projects over the distal portion of the SVC.  IMPRESSION: Persistent bilateral pleural effusions and bilateral pulmonary interstitial edema consistent with CHF. There  has been slight interval deterioration since yesterday's study.   Electronically Signed   By: David  Martinique   On: 12/15/2014 07:45   Dg Chest Port 1 View  12/14/2014   CLINICAL DATA:  Septic shock. Central line placement. Sixteen weeks pregnant.  EXAM: PORTABLE CHEST - 1 VIEW  COMPARISON:  None.  FINDINGS: Right central venous catheter. Tip is at the level of the cavoatrial junction. No pneumothorax. Normal heart size and pulmonary vascularity. Bilateral perihilar basilar infiltrates. Possible pleural effusions. No pneumothorax.  IMPRESSION: Right central venous catheter appears in satisfactory position. No pneumothorax. Basal perihilar infiltrates bilaterally with possible pleural effusions.   Electronically Signed   By: Lucienne Capers M.D.   On: 12/14/2014 00:59    ASSESSMENT / PLAN:  PULMONARY A: pulm edema, unimpressed PNA as source P:   Keep O2 sats greater than 94% Even balance goals at this point. KVO IVF. Watch for when pitocin is given there is higher incident of pulmonary edema. Hold in the ICU for observation. No  lasix needed as on min O2 pCXR in am.  CARDIOVASCULAR A: Septic shock, r/o rel AI P:  KVO now, pulm edema improving cvp noted. Will attempt to keep off neo. Cortisol 36.3, d/c stress dose steroid.  RENAL A:  AKI, hypovolemia resolved, hypokalemia, NONAG met acidosis P:   Repeat bmet in am  Avoid saline KVO IVF. Replace electrolytes as indicated.  GASTROINTESTINAL A:  ??Acute cholecystitis, abdo pain improved P:   Clear liquid diet and will defer advancing to surgery. She has made clincial progress, d/w surgery, will continued ABX and follow clinical status CT abdo if declines LFt again in am   HEMATOLOGIC A:  DVt prevention, thrombocytopenia (se4psis, dilution), higher risk clot (ICU, preggy) P:  Watch CBCs in am further Sub q hep, continued but dc in am if drops Treating sepsis  INFECTIOUS A:  Septic shock; Unclear abdo source?, r/o urine  source (neg [place] hdro or pyelo)) P:   BCx2 2/23 >>> UC 2/23 >>> GC/Chlamydia 2/23 >>>NTD HIV 2/23 >>>Neg RPR 2/23 >>>Neg Abx:  Zosyn, start date 2/23  Pct not helpful in pregnancy If declines - CT of the abdomen and consider gal bladder drain.  ENDOCRINE A:  R.o rel AI P:   Watch CBGs TSH wnl  NEUROLOGIC A:  Anxiety P:   RASS goal: 0 counseling  REPRODUCTIVE A: G1 at 16w 1d by 2nd trimester [place], tissue in vaginal vault, likely spontaneous abortion. P: OB called to address spontaneous abortion. ?pitocin. ?U/S of the abdomen.  FAMILY  - Updates: Family and patient updated at bedside daily  - Inter-disciplinary family meet or Palliative Care meeting due by: 12/19/2014  Will hold in the ICU today given overnight events and soft BP.  The patient is critically ill with multiple organ systems failure and requires high complexity decision making for assessment and support, frequent evaluation and titration of therapies, application of advanced monitoring technologies and extensive interpretation of multiple databases.   Critical Care Time devoted to patient care services described in this note is  35  Minutes. This time reflects time of care of this signee Dr Jennet Maduro. This critical care time does not reflect procedure time, or teaching time or supervisory time of PA/NP/Med student/Med Resident etc but could involve care discussion time.  Rush Farmer, M.D. [facility] Pulmonary/Critical Care Medicine. Pager: [phone]. After hours pager: [phone].

## 2014-12-15 NOTE — Progress Notes (Signed)
CCS/Preslie Depasquale Progress Note    Subjective: Patient feels much better today.  Had bowel movement and passed gas.  Will start clear liquids.  Objective: Vital signs in last 24 hours: Temp:  [97.6 F (36.4 C)-99 F (37.2 C)] 98.9 F (37.2 C) (02/25 0405) Pulse Rate:  [73-106] 95 (02/25 0715) Resp:  [17-40] 22 (02/25 0715) BP: (78-111)/(40-78) 105/65 mmHg (02/25 0715) SpO2:  [90 %-98 %] 94 % (02/25 0600) Weight:  [50.1 kg (110 lb 7.2 oz)] 50.1 kg (110 lb 7.2 oz) (02/25 0600) Last BM Date: 12/13/14  Intake/Output from previous day: 02/24 0701 - 02/25 0700 In: 910.4 [I.V.:500.4; IV Piggyback:250] Out: 1575 [Urine:1575] Intake/Output this shift:    General: No acute distress  Lungs: Clear  Abd: Soft, distended mildly.  Starting having vaginal bleeding.  ? Spontaneous abortion.  Good bowel sounds.    Extremities: No DVT signs sor symptoms  Neuro: Intact.  Somewhat drowsy.  Lab Results:  @LABLAST2 (wbc:2,hgb:2,hct:2,plt:2) BMET  Recent Labs  12/14/14 0451 12/14/14 1400  NA 137 141  K 3.1* 3.5  CL 114* 117*  CO2 14* 17*  GLUCOSE 94 84  BUN 15 13  CREATININE 1.09 0.88  CALCIUM 6.9* 7.4*   PT/INR No results for input(s): LABPROT, INR in the last 72 hours. ABG No results for input(s): PHART, HCO3 in the last 72 hours.  Invalid input(s): PCO2, PO2  Studies/Results: [place] Abdomen Complete  12/13/2014   CLINICAL DATA:  Right upper quadrant pain, nausea  EXAM: ULTRASOUND ABDOMEN COMPLETE  COMPARISON:  None.  FINDINGS: Gallbladder: Gallbladder sludge and a gallstone is noted within gallbladder measures 1.1 cm. Mild thickening of gallbladder wall up to 3.4 mm. Although there is no sonographic Murphy's sign, early cholecystitis cannot be excluded. Clinical correlation is necessary.  Common bile duct: Diameter: 2.8 mm in diameter within normal limits.  Liver: No focal lesion identified. Within normal limits in parenchymal echogenicity.  IVC: No abnormality visualized.  Pancreas:  Visualized portion unremarkable.  Spleen: Size and appearance within normal limits. Measures 10.2 cm in length. Accessory splenule measures 1.8 x 1.7 cm.  Right Kidney: Length: 11.9 cm. Echogenicity within normal limits. No mass or hydronephrosis visualized.  Left Kidney: Length: 11.7 cm. Echogenicity within normal limits. No mass or hydronephrosis visualized.  Abdominal aorta: No aneurysm visualized. Measures up to 1.6 cm in diameter.  Other findings: None.  IMPRESSION: 1. Gallbladder sludge and a gallstone is noted within gallbladder measures 1.1 cm. Mild thickening of gallbladder wall up to 3.4 mm. Although there is no sonographic Murphy's sign, early cholecystitis cannot be excluded. Clinical correlation is necessary. 2. Normal CBD. 3. No hydronephrosis.   Electronically Signed   By: Natasha MeadLiviu  Pop M.D.   On: 12/13/2014 17:44   [place] Ob Transvaginal  12/13/2014   CLINICAL DATA:  Evaluate dates.  On known last menstrual.  EXAM: LIMITED OBSTETRIC ULTRASOUND  FINDINGS: Number of Fetuses: 1  Heart Rate:  150 bpm  Movement: Yes  Presentation: Cephalic  Placental Location: Anterior  Previa: No  Amniotic Fluid (Subjective):  Normal  BPD:  3.3cm 16w  1d  MATERNAL FINDINGS:  Cervix:  Appears closed.  Uterus/Adnexae:  No abnormality visualized.  IMPRESSION: 1. Single living intrauterine gestation. The estimated gestational age is 16 weeks and 1 day.  This exam is performed on an emergent basis and does not comprehensively evaluate fetal size, dating, or anatomy; follow-up complete OB US should be considered if further fetal assessment is warranted.   Electronically Signed   By: Ladona Ridgelaylor  Bradly Chris M.D.   On: 12/13/2014 19:02   Dg Chest Port 1 View  12/14/2014   CLINICAL DATA:  Septic shock. Central line placement. Sixteen weeks pregnant.  EXAM: PORTABLE CHEST - 1 VIEW  COMPARISON:  None.  FINDINGS: Right central venous catheter. Tip is at the level of the cavoatrial junction. No pneumothorax. Normal heart size and pulmonary  vascularity. Bilateral perihilar basilar infiltrates. Possible pleural effusions. No pneumothorax.  IMPRESSION: Right central venous catheter appears in satisfactory position. No pneumothorax. Basal perihilar infiltrates bilaterally with possible pleural effusions.   Electronically Signed   By: Burman Nieves M.D.   On: 12/14/2014 00:59    Anti-infectives: Anti-infectives    Start     Dose/Rate Route Frequency Ordered Stop   12/14/14 0100  piperacillin-tazobactam (ZOSYN) IVPB 3.375 g     3.375 g 12.5 mL/hr over 240 Minutes Intravenous Every 8 hours 12/13/14 2029     12/13/14 2030  piperacillin-tazobactam (ZOSYN) IVPB 3.375 g  Status:  Discontinued     3.375 g 100 mL/hr over 30 Minutes Intravenous  Once 12/13/14 2027 12/13/14 2029   12/13/14 1545  piperacillin-tazobactam (ZOSYN) IVPB 2.25 g     2.25 g 100 mL/hr over 30 Minutes Intravenous  Once 12/13/14 1539 12/13/14 2020      Assessment/Plan: s/p  Advance diet  LOS: 2 days   Marta Lamas. Gae Bon, MD, FACS [phone] [facility] 12/15/2014

## 2014-12-16 ENCOUNTER — Inpatient Hospital Stay (HOSPITAL_COMMUNITY): Payer: Medicaid Other

## 2014-12-16 DIAGNOSIS — D62 Acute posthemorrhagic anemia: Secondary | ICD-10-CM | POA: Diagnosis not present

## 2014-12-16 DIAGNOSIS — Z9289 Personal history of other medical treatment: Secondary | ICD-10-CM

## 2014-12-16 HISTORY — DX: Personal history of other medical treatment: Z92.89

## 2014-12-16 LAB — HEMOGLOBIN AND HEMATOCRIT, BLOOD
HEMATOCRIT: 23.3 % — AB (ref 36.0–46.0)
HEMOGLOBIN: 8.2 g/dL — AB (ref 12.0–15.0)

## 2014-12-16 LAB — BASIC METABOLIC PANEL
Anion gap: 6 (ref 5–15)
BUN: 13 mg/dL (ref 6–23)
CALCIUM: 7.2 mg/dL — AB (ref 8.4–10.5)
CO2: 20 mmol/L (ref 19–32)
Chloride: 109 mmol/L (ref 96–112)
Creatinine, Ser: 0.68 mg/dL (ref 0.50–1.10)
GFR calc Af Amer: >90 mL/min (ref >90)
Glucose, Bld: 75 mg/dL (ref 70–99)
Potassium: 3.8 mmol/L (ref 3.5–5.1)
Sodium: 135 mmol/L (ref 135–145)

## 2014-12-16 LAB — HEPATIC FUNCTION PANEL
ALK PHOS: 120 U/L — AB (ref 39–117)
ALT: 29 U/L (ref 0–35)
AST: 43 U/L — ABNORMAL HIGH (ref 0–37)
Albumin: 1.7 g/dL — ABNORMAL LOW (ref 3.5–5.2)
BILIRUBIN DIRECT: 0.1 mg/dL (ref 0.0–0.5)
BILIRUBIN INDIRECT: 0 mg/dL — AB (ref 0.3–0.9)
BILIRUBIN TOTAL: 0.1 mg/dL — AB (ref 0.3–1.2)
Total Protein: 4 g/dL — ABNORMAL LOW (ref 6.0–8.3)

## 2014-12-16 LAB — CBC
HCT: 17 % — ABNORMAL LOW (ref 36.0–46.0)
Hemoglobin: 5.9 g/dL — CL (ref 12.0–15.0)
MCH: 28.9 pg (ref 26.0–34.0)
MCHC: 34.7 g/dL (ref 30.0–36.0)
MCV: 83.3 fL (ref 78.0–100.0)
Platelets: 91 10*3/uL — ABNORMAL LOW (ref 150–400)
RBC: 2.04 MIL/uL — ABNORMAL LOW (ref 3.87–5.11)
RDW: 16.3 % — AB (ref 11.5–15.5)
WBC: 15.8 10*3/uL — ABNORMAL HIGH (ref 4.0–10.5)

## 2014-12-16 LAB — MAGNESIUM: MAGNESIUM: 1.4 mg/dL — AB (ref 1.5–2.5)

## 2014-12-16 LAB — PHOSPHORUS: PHOSPHORUS: 2.7 mg/dL (ref 2.3–4.6)

## 2014-12-16 LAB — PREPARE RBC (CROSSMATCH)

## 2014-12-16 MED ORDER — MAGNESIUM SULFATE 4 GM/100ML IV SOLN
4.0000 g | Freq: Once | INTRAVENOUS | Status: AC
  Administered 2014-12-16: 4 g via INTRAVENOUS
  Filled 2014-12-16: qty 100

## 2014-12-16 MED ORDER — PHENYLEPHRINE HCL 10 MG/ML IJ SOLN
30.0000 ug/min | INTRAVENOUS | Status: DC
  Filled 2014-12-16: qty 4

## 2014-12-16 MED ORDER — SODIUM CHLORIDE 0.9 % IV SOLN
Freq: Once | INTRAVENOUS | Status: AC
  Administered 2014-12-16: 10:00:00 via INTRAVENOUS

## 2014-12-16 MED ORDER — ACETAMINOPHEN 325 MG PO TABS
650.0000 mg | ORAL_TABLET | Freq: Once | ORAL | Status: AC
  Administered 2014-12-16: 650 mg via ORAL
  Filled 2014-12-16: qty 2

## 2014-12-16 MED ORDER — SODIUM CHLORIDE 0.9 % IV BOLUS (SEPSIS)
1000.0000 mL | Freq: Once | INTRAVENOUS | Status: AC
  Administered 2014-12-16: 1000 mL via INTRAVENOUS

## 2014-12-16 MED ORDER — DIPHENHYDRAMINE HCL 25 MG PO CAPS
25.0000 mg | ORAL_CAPSULE | Freq: Once | ORAL | Status: AC
  Administered 2014-12-16: 25 mg via ORAL
  Filled 2014-12-16: qty 1

## 2014-12-16 NOTE — Progress Notes (Signed)
CCS/Jeanette Copeland Progress Note 1 Day Post-Op  Subjective: Patient feeling better although she has a hemoglobin of 5.9.  Not sure why we are giving her pressors to maintain BP when she probably needs volume.  Objective: Vital signs in last 24 hours: Temp:  [97.9 F (36.6 C)-100.2 F (37.9 C)] 98 F (36.7 C) (02/26 0338) Pulse Rate:  [59-139] 79 (02/26 0900) Resp:  [12-41] 22 (02/26 0900) BP: (74-116)/(50-83) 94/65 mmHg (02/26 0900) SpO2:  [74 %-100 %] 100 % (02/26 0600) FiO2 (%):  [100 %] 100 % (02/25 1050) Weight:  [51 kg (112 lb 7 oz)] 51 kg (112 lb 7 oz) (02/26 0500) Last BM Date: 12/15/14  Intake/Output from previous day: 02/25 0701 - 02/26 0700 In: 993.6 [I.V.:731.1; IV Piggyback:112.5] Out: 1550 [Urine:950; Blood:600] Intake/Output this shift:    General: No acute distress  Lungs: Clear to auscultation.  Abd: Benign  Extremities: No changes  Neuro: Intact  Lab Results:   BMET  Recent Labs  12/14/14 1400 12/16/14 0500  NA 141 135  K 3.5 3.8  CL 117* 109  CO2 17* 20  GLUCOSE 84 75  BUN 13 13  CREATININE 0.88 0.68  CALCIUM 7.4* 7.2*   PT/INR  Recent Labs  12/15/14 1100  LABPROT 13.2  INR 0.99   ABG No results for input(s): PHART, HCO3 in the last 72 hours.  Invalid input(s): PCO2, PO2  Studies/Results: US Pelvis Complete  12/16/2014   CLINICAL DATA:  Recent spontaneous abortion. Evaluate for retained products.  EXAM: TRANSABDOMINAL ULTRASOUND OF PELVIS  TECHNIQUE: Transabdominal ultrasound examination of the pelvis was performed including evaluation of the uterus, ovaries, adnexal regions, and pelvic cul-de-sac.  COMPARISON:  12/13/2014  FINDINGS: Uterus  Measurements: 12.0 x 7.1 x 7.3 cm. No fibroids or other mass visualized.  Endometrium  Thickness: 0.8 cm. Endometrial stripe is mildly heterogeneous. No significant fluid within the endometrial cavity.  Right ovary  Measurements: Not visualized.  Left ovary  Measurements: 3.1 x 1.7 x 1.6 cm. Normal  appearance with a small follicle.  Other findings:  Small amount of fluid in the cul-de-sac.  IMPRESSION: No evidence for an intrauterine pregnancy. Findings consistent with recent spontaneous abortion.  Endometrial stripe is mildly heterogeneous without gross abnormality. No clear evidence for retained products.  Small amount of free fluid.  Right ovary is not visualized.   Electronically Signed   By: Richarda Overlie M.D.   On: 12/16/2014 08:27   Dg Chest Port 1 View  12/16/2014   CLINICAL DATA:  Difficulty breathing  EXAM: PORTABLE CHEST - 1 VIEW  COMPARISON:  December 15, 2014  FINDINGS: Central catheter tip is in the right atrium. No pneumothorax. There is bilateral mid and lower lung zone airspace consolidation with effusions. Heart is upper normal with pulmonary vascularity indicative of pulmonary venous hypertension. No new opacity.  IMPRESSION: Findings felt to be consistent with congestive heart failure. Superimposed pneumonia in the lower lung zones cannot be excluded. Both entities may exist concurrently. No new opacity. No pneumothorax.   Electronically Signed   By: Bretta Bang III M.D.   On: 12/16/2014 07:17   Dg Chest Port 1 View  12/15/2014   CLINICAL DATA:  Acute pulmonary edema, septic shock.  EXAM: PORTABLE CHEST - 1 VIEW  COMPARISON:  Portable chest x-ray of December 14, 2014  FINDINGS: The lung volumes remain low. Moderate-sized pleural effusions persist layering posteriorly. The pulmonary interstitial markings remain increased. The pulmonary vascularity remains engorged. The cardiac silhouette is largely obscured. The  mediastinum is normal in width. The right internal jugular venous catheter tip projects over the distal portion of the SVC.  IMPRESSION: Persistent bilateral pleural effusions and bilateral pulmonary interstitial edema consistent with CHF. There has been slight interval deterioration since yesterday's study.   Electronically Signed   By: David  [place]az[place]   On: 12/15/2014 07:45     Anti-infectives: Anti-infectives    Start     Dose/Rate Route Frequency Ordered Stop   12/14/14 0100  piperacillin-tazobactam (ZOSYN) IVPB 3.375 g     3.375 g 12.5 mL/hr over 240 Minutes Intravenous Every 8 hours 12/13/14 2029     12/13/14 2030  piperacillin-tazobactam (ZOSYN) IVPB 3.375 g  Status:  Discontinued     3.375 g 100 mL/hr over 30 Minutes Intravenous  Once 12/13/14 2027 12/13/14 2029   12/13/14 1545  piperacillin-tazobactam (ZOSYN) IVPB 2.25 g     2.25 g 100 mL/hr over 30 Minutes Intravenous  Once 12/13/14 1539 12/13/14 2020      Assessment/Plan: s/p Procedure(s): DILATATION AND EVACUATION Patient without surgical problem.  We will sign off.  Mother does not understand why the patient is anemic and why she was septic.  I did not give her any information at the patient's prior request.  LOS: 3 days   Marta LamasJames O. Gae BonWyatt, III, MD, FACS [phone]--pager [phone]--office [facility] 12/16/2014

## 2014-12-16 NOTE — Progress Notes (Signed)
ANTIBIOTIC CONSULT NOTE - Follow-up  Pharmacy Consult for Zosyn Indication: cholecystitis, UTI  No Known Allergies  Patient Measurements: Height: 5\' 5"  (165.1 cm) Weight: 112 lb 7 oz (51 kg) IBW/kg (Calculated) : 57  Vital Signs: Temp: 98 Copeland (36.7 C) (02/26 0338) Temp Source: Oral (02/26 0338) BP: 85/52 mmHg (02/26 0600) Pulse Rate: 63 (02/26 0600) Intake/Output from previous day: 02/25 0701 - 02/26 0700 In: 993.6 [I.V.:731.1; IV Piggyback:112.5] Out: 1550 [Urine:950; Blood:600] Intake/Output from this shift:    Labs:  Recent Labs  12/14/14 0451 12/14/14 1400 12/15/14 1100 12/15/14 1103 12/16/14 0500  WBC 23.2*  --   --  16.5* 15.8*  HGB 10.0*  --   --  10.3* 5.9*  PLT Jeanette*  --  83* 81* 91*  CREATININE 1.09 0.Jeanette  --   --  0.68   Estimated Creatinine Clearance: 90.3 mL/min (by C-G formula based on Cr of 0.68). No results for input(s): VANCOTROUGH, VANCOPEAK, VANCORANDOM, GENTTROUGH, GENTPEAK, GENTRANDOM, TOBRATROUGH, TOBRAPEAK, TOBRARND, AMIKACINPEAK, AMIKACINTROU, AMIKACIN in the last 72 hours.   Microbiology: Recent Results (from the past 720 hour(s))  Wet prep, genital     Status: Abnormal   Collection Time: 12/13/14  4:51 PM  Result Value Ref Range Status   Yeast Wet Prep HPF POC FEW (A) NONE SEEN Final   Trich, Wet Prep NONE SEEN NONE SEEN Final   Clue Cells Wet Prep HPF POC NONE SEEN NONE SEEN Final   WBC, Wet Prep HPF POC FEW (A) NONE SEEN Final  Culture, blood (routine x 2)     Status: None (Preliminary result)   Collection Time: 12/13/14  8:10 PM  Result Value Ref Range Status   Specimen Description BLOOD LEFT ARM  Final   Special Requests BOTTLES DRAWN AEROBIC AND ANAEROBIC 5CC  Final   Culture   Final           BLOOD CULTURE RECEIVED NO GROWTH TO DATE CULTURE WILL BE HELD FOR 5 DAYS BEFORE ISSUING A FINAL NEGATIVE REPORT Performed at [facility]    Report Status PENDING  Incomplete  Culture, blood (routine x 2)     Status: None  (Preliminary result)   Collection Time: 12/13/14  8:17 PM  Result Value Ref Range Status   Specimen Description BLOOD LEFT HAND  Final   Special Requests BOTTLES DRAWN AEROBIC AND ANAEROBIC 2CC  Final   Culture   Final           BLOOD CULTURE RECEIVED NO GROWTH TO DATE CULTURE WILL BE HELD FOR 5 DAYS BEFORE ISSUING A FINAL NEGATIVE REPORT Performed at [facility]    Report Status PENDING  Incomplete  Urine culture     Status: None   Collection Time: 12/13/14  8:57 PM  Result Value Ref Range Status   Specimen Description URINE, CLEAN CATCH  Final   Special Requests NONE  Final   Colony Count   Final    >=100,000 COLONIES/ML Performed at [facility]    Culture   Final    Multiple bacterial morphotypes present, none predominant. Suggest appropriate recollection if clinically indicated. Performed at [facility]    Report Status 12/14/2014 FINAL  Final  MRSA PCR Screening     Status: None   Collection Time: 12/13/14 11:00 PM  Result Value Ref Range Status   MRSA by PCR NEGATIVE NEGATIVE Final    Comment:        The GeneXpert MRSA Assay (FDA approved for NASAL specimens  only), is one component of a comprehensive MRSA colonization surveillance program. It is not intended to diagnose MRSA infection nor to guide or monitor treatment for MRSA infections.    Assessment: 19 yo Copeland presents on 2/23 with vomiting and chills, ultimately leading to septic shock. Patient on Zosyn (Day #4) for cholecystitis/UTI. WBC trending down to 15.8. Tm 100.2 LA down to nl. Noted that pt is s/p spontaneous abortion 2/25. Pt restarted pressors last night.  SCr remains stable.  Zosyn 2/23 >>  2/23 MRSA pcr (-) 2/23 Urine >>100kcol mult bacteria 2/23 Bld x2 >>ngtd  Goal of Therapy:  Resolution of infection  Plan:  Continue Zosyn 3.375g IV Q8 - extended interval dosing Monitor renal function, clinical picture, micro data  Christoper Fabian, PharmD, BCPS Clinical  pharmacist, pager [phone] 12/16/2014,8:03 AM

## 2014-12-16 NOTE — Progress Notes (Addendum)
Chaplain responded to consult twice.  Pt still asleep, needs rest.  Chaplain will check back later once pt is awake.  Chaplain to continue to follow up.    12/16/14 1300  Clinical Encounter Type  Visited With Patient not available;Health care provider  Visit Type Initial;Psychological support;Social support;Critical Care  Referral From Nurse  Stress Factors  Patient Stress Factors Exhausted   Blain PaisOvercash, Mister Krahenbuhl A, Chaplain 12/16/2014 1:21 PM  Chaplain followed up with pt who is now awake.  Pt has family at bedside but still seems very tired.  Chaplain provided presence and emotional support.  Pt aware of chaplain services and may wish for another visit later, for now pt wishes to rest.  Chaplain will follow up if/as needed.  Erroll Lunavercash, Nya Monds A, Chaplain 12/16/2014  4:16 PM

## 2014-12-16 NOTE — Progress Notes (Signed)
PULMONARY / CRITICAL CARE MEDICINE   Name: Jeanette Copeland MRN: [id number] DOB: 1995-12-22    ADMISSION DATE:  12/13/2014 CONSULTATION DATE: 12/13/2014  REFERRING MD : [facility]  CHIEF COMPLAINT: Septic shock  INITIAL PRESENTATION: Patient presented to the ED with vomiting and chills. She was found to be hypotensive, requiring 5L bolus of NS, to keep systolic pressures in 67T. Found to be pregnant on urine pregnancy with confirmation on ultrasound.  STUDIES:  Abdominal US: sludge with concern for cholecystitis Transvaginal US: Single IUP at 16w 1d  SIGNIFICANT EVENTS: 2/23: Patient admitted 2/25: Tissue passing in bedside commode from the vaginal vault.  SUBJECTIVE:  Neo restarted last night for low BPs, currently at 57mg Hemoglobin of 5.6 today Patient passed multiple large clots per vagina, d&c not performed Afebrile (T-max 100.2)  VITAL SIGNS: Temp:  [97.3 F (36.3 C)-100.2 F (37.9 C)] 98 F (36.7 C) (02/26 0338) Pulse Rate:  [59-139] 63 (02/26 0600) Resp:  [12-41] 19 (02/26 0600) BP: (74-116)/(50-83) 85/52 mmHg (02/26 0600) SpO2:  [74 %-100 %] 100 % (02/26 0600) FiO2 (%):  [100 %] 100 % (02/25 1050) Weight:  [112 lb 7 oz (51 kg)] 112 lb 7 oz (51 kg) (02/26 0500) HEMODYNAMICS: CVP:  [8 mmHg] 8 mmHg VENTILATOR SETTINGS: Vent Mode:  [-]  FiO2 (%):  [100 %] 100 % INTAKE / OUTPUT:  Intake/Output Summary (Last 24 hours) at 12/16/14 0703 Last data filed at 12/16/14 0600  Gross per 24 hour  Intake 993.59 ml  Output   1550 ml  Net -556.41 ml   PHYSICAL EXAMINATION: General: Fair appearing female, no distress, hypotensive. Neuro: Alert, oriented, moves extremities spontaneously. NAD. HEENT: [place]/AT, PERRL, EOM-I and MMM. Cardiovascular: Nl S1/S2 Regular rate and rhythm, no murmurs. Lungs: CTA bilaterally. Abdomen: Soft, non-tender, bowel sounds. Musculoskeletal: No edema. Skin: No rashes, intact, looks pale.  LABS:  CBC  Recent Labs Lab  12/14/14 0451 12/15/14 1100 12/15/14 1103 12/16/14 0500  WBC 23.2*  --  16.5* 15.8*  HGB 10.0*  --  10.3* 5.9*  HCT 28.4*  --  29.7* 17.0*  PLT 88* 83* 81* 91*   Coag's  Recent Labs Lab 12/15/14 1100  APTT 30  INR 0.99   BMET  Recent Labs Lab 12/14/14 0451 12/14/14 1400 12/16/14 0500  NA 137 141 135  K 3.1* 3.5 3.8  CL 114* 117* 109  CO2 14* 17* 20  BUN 15 13 13   CREATININE 1.09 0.88 0.68  GLUCOSE 94 84 75   Electrolytes  Recent Labs Lab 12/14/14 0451 12/14/14 1400 12/16/14 0500  CALCIUM 6.9* 7.4* 7.2*  MG 1.3*  --  1.4*  PHOS 2.1*  --  2.7   Sepsis Markers  Recent Labs Lab 12/13/14 1835 12/14/14 1400 12/14/14 1710  LATICACIDVEN 3.23* 0.8 0.8   ABG No results for input(s): PHART, PCO2ART, PO2ART in the last 168 hours. Liver Enzymes  Recent Labs Lab 12/13/14 1337 12/14/14 0451 12/16/14 0500  AST 92* 87* 43*  ALT 49* 47* 29  ALKPHOS 535* 130* 120*  BILITOT 2.0* 1.0 0.1*  ALBUMIN 2.6* 1.9* 1.7*   Cardiac Enzymes No results for input(s): TROPONINI, PROBNP in the last 168 hours. Glucose  Recent Labs Lab 12/14/14 0038  GLUCAP 108*   Imaging Dg Chest Port 1 View  12/15/2014   CLINICAL DATA:  Acute pulmonary edema, septic shock.  EXAM: PORTABLE CHEST - 1 VIEW  COMPARISON:  Portable chest x-ray of December 14, 2014  FINDINGS: The lung volumes remain low.  Moderate-sized pleural effusions persist layering posteriorly. The pulmonary interstitial markings remain increased. The pulmonary vascularity remains engorged. The cardiac silhouette is largely obscured. The mediastinum is normal in width. The right internal jugular venous catheter tip projects over the distal portion of the SVC.  IMPRESSION: Persistent bilateral pleural effusions and bilateral pulmonary interstitial edema consistent with CHF. There has been slight interval deterioration since yesterday's study.   Electronically Signed   By: David  Martinique   On: 12/15/2014 07:45   ASSESSMENT /  PLAN:  PULMONARY A: pulm edema, unimpressed PNA as source P:  Keep O2 sats greater than 94% Even balance goals at this point. NS 1L bolus. Hold in the ICU for observation. No lasix needed as on min O2. pCXR in am.  CARDIOVASCULAR A: Septic shock, r/o rel AI P:  KVO now, pulm edema improving cvp noted. Will attempt to wean off neo. Cortisol 36.3, d/c stress dose steroid.  RENAL A: AKI, hypovolemia resolved, hypokalemia, NONAG met acidosis Hypomagnesemia P:  Repeat bmet in am. Avoid saline. NS 1L bolus. Replace electrolytes as indicated.  GASTROINTESTINAL A: ??Acute cholecystitis, abdo pain improved, LFTs improving P:  Regular diet. She has made clincial progress, d/w surgery, will continued ABX and follow clinical status CT abdo if declines. LFT again in am.  HEMATOLOGIC A: DVt prevention, thrombocytopenia (sepsis, dilution), higher risk clot (ICU, preggy) Acute blood loss anemia P:  Watch CBCs in am further. Sub q hep, continued but dc in am if drops. Treating sepsis. 2U PRBC per OB/GYN.  INFECTIOUS A: Septic shock; Unclear abdo source?, r/o urine source (neg [place] hdro or pyelo) P:  BCx2 2/23 >>> No growth to date UC 2/23 >>> multiple morphotypes GC/Chlamydia 2/23 >>>Negative HIV 2/23 >>>Neg RPR 2/23 >>>Neg Abx:  Zosyn, start date 2/23>>>  Pct not helpful in pregnancy. If declines - CT of the abdomen and consider gal bladder drain.  ENDOCRINE A: R.o rel AI P:  Watch CBGs. TSH WNL.  NEUROLOGIC A: Anxiety P:  RASS goal: 0 counseling  REPRODUCTIVE A: G1 at 16w 1d by 2nd trimester [place], s/p spontaneous abortion w/ possibility of retained fetal products P: OB/gyn following U/S of the abdomen to assess for retained products  FAMILY  - Updates: Family and patient updated at bedside daily  - Inter-disciplinary family meet or Palliative Care meeting due by: 12/19/2014   Cordelia Poche, MD PGY-2, [place]  Mother now knows about pregnancy, she is appreciative of protecting her privacy.  Will keep in the ICU overnight, transfuse and if BP remains stable then transfer to SDU in AM.  The patient is critically ill with multiple organ systems failure and requires high complexity decision making for assessment and support, frequent evaluation and titration of therapies, application of advanced monitoring technologies and extensive interpretation of multiple databases.   Critical Care Time devoted to patient care services described in this note is  35  Minutes. This time reflects time of care of this signee Dr Jennet Maduro. This critical care time does not reflect procedure time, or teaching time or supervisory time of PA/NP/Med student/Med Resident etc but could involve care discussion time.  Rush Farmer, M.D. [facility] Pulmonary/Critical Care Medicine. Pager: [phone]. After hours pager: [phone].    12/16/2014, 7:03 AM

## 2014-12-16 NOTE — Progress Notes (Addendum)
Post Partum Day 1 s/p 16 week SAB Subjective: Patient having normal lochia, but started pressers again last night to maintain blood pressure.  Hg this AM 5.9. Tolerated liquids last night.  Denies abdominal pain or pelvic cramping.  Objective: Blood pressure 85/52, pulse 63, temperature 98 F (36.7 C), temperature source Oral, resp. rate 19, height 5\' 5"  (1.651 m), weight 112 lb 7 oz (51 kg), SpO2 100 %.  Physical Exam:  General: alert, cooperative and no distress Lochia: appropriate Uterine Fundus: firm DVT Evaluation: No evidence of DVT seen on physical exam. Negative Homan's sign. No cords or calf tenderness. SCDs on.   Recent Labs  12/15/14 1103 12/16/14 0500  HGB 10.3* 5.9*  HCT 29.7* 17.0*   Results for orders placed or performed during the hospital encounter of 12/13/14 (from the past 24 hour(s))  DIC (disseminated intravasc coag) panel     Status: Abnormal   Collection Time: 12/15/14 11:00 AM  Result Value Ref Range   Prothrombin Time 13.2 11.6 - 15.2 seconds   INR 0.99 0.00 - 1.49   aPTT 30 24 - 37 seconds   Fibrinogen 546 (H) 204 - 475 mg/dL   D-Dimer, Quant >16.10>20.00 (H) 0.00 - 0.48 ug/mL-FEU   Platelets 83 (L) 150 - 400 K/uL   Smear Review NO SCHISTOCYTES SEEN   CBC     Status: Abnormal   Collection Time: 12/15/14 11:03 AM  Result Value Ref Range   WBC 16.5 (H) 4.0 - 10.5 K/uL   RBC 3.54 (L) 3.87 - 5.11 MIL/uL   Hemoglobin 10.3 (L) 12.0 - 15.0 g/dL   HCT 96.029.7 (L) 45.436.0 - 09.846.0 %   MCV 83.9 78.0 - 100.0 fL   MCH 29.1 26.0 - 34.0 pg   MCHC 34.7 30.0 - 36.0 g/dL   RDW 11.916.7 (H) 14.711.5 - 82.915.5 %   Platelets 81 (L) 150 - 400 K/uL  Hepatic function panel     Status: Abnormal   Collection Time: 12/16/14  5:00 AM  Result Value Ref Range   Total Protein 4.0 (L) 6.0 - 8.3 g/dL   Albumin 1.7 (L) 3.5 - 5.2 g/dL   AST 43 (H) 0 - 37 U/L   ALT 29 0 - 35 U/L   Alkaline Phosphatase 120 (H) 39 - 117 U/L   Total Bilirubin 0.1 (L) 0.3 - 1.2 mg/dL   Bilirubin, Direct 0.1 0.0 -  0.5 mg/dL   Indirect Bilirubin 0.0 (L) 0.3 - 0.9 mg/dL  CBC     Status: Abnormal   Collection Time: 12/16/14  5:00 AM  Result Value Ref Range   WBC 15.8 (H) 4.0 - 10.5 K/uL   RBC 2.04 (L) 3.87 - 5.11 MIL/uL   Hemoglobin 5.9 (LL) 12.0 - 15.0 g/dL   HCT 56.217.0 (L) 13.036.0 - 86.546.0 %   MCV 83.3 78.0 - 100.0 fL   MCH 28.9 26.0 - 34.0 pg   MCHC 34.7 30.0 - 36.0 g/dL   RDW 78.416.3 (H) 69.611.5 - 29.515.5 %   Platelets 91 (L) 150 - 400 K/uL  Basic metabolic panel     Status: Abnormal   Collection Time: 12/16/14  5:00 AM  Result Value Ref Range   Sodium 135 135 - 145 mmol/L   Potassium 3.8 3.5 - 5.1 mmol/L   Chloride 109 96 - 112 mmol/L   CO2 20 19 - 32 mmol/L   Glucose, Bld 75 70 - 99 mg/dL   BUN 13 6 - 23 mg/dL   Creatinine, Ser 2.840.68  0.50 - 1.10 mg/dL   Calcium 7.2 (L) 8.4 - 10.5 mg/dL   GFR calc non Af Amer >90 >90 mL/min   GFR calc Af Amer >90 >90 mL/min   Anion gap 6 5 - 15  Magnesium     Status: Abnormal   Collection Time: 12/16/14  5:00 AM  Result Value Ref Range   Magnesium 1.4 (L) 1.5 - 2.5 mg/dL  Phosphorus     Status: None   Collection Time: 12/16/14  5:00 AM  Result Value Ref Range   Phosphorus 2.7 2.3 - 4.6 mg/dL    Reviewed medications   Assessment/Plan: 1.  SAB 2.  Sepsis 3.  Symptomatic anemia 4.  ARI - resolved 5.  Shock Liver - resolved  As she was needing pressers to maintain, will transfuse 2 units.   Having normal lochia.  [place] last night incomplete, will repeat this AM to assure no retained products.   LOS: 3 days   Candelaria Celeste JEHIEL 12/16/2014, 7:40 AM   Please call [phone] for the OB/Gyn attending on call for any concerns or questions.

## 2014-12-16 NOTE — Progress Notes (Signed)
CRITICAL VALUE ALERT  Critical value received:  hgb 5.9  Date of notification:  12/16/2014  Time of notification:  0620  Critical value read back:Yes.    Nurse who received alert:  Lillia CorporalHancock, Jubal Rademaker Elizabeth  MD notified (1st page):  Dr. Sung AmabileSimonds  Time of first page:  0620  MD notified (2nd page):  Time of second page:  Responding MD:  Dr. Sung AmabileSimonds  Time MD responded:  [phone]

## 2014-12-17 ENCOUNTER — Encounter: Payer: Self-pay | Admitting: Obstetrics and Gynecology

## 2014-12-17 DIAGNOSIS — D62 Acute posthemorrhagic anemia: Secondary | ICD-10-CM

## 2014-12-17 LAB — TYPE AND SCREEN
ABO/RH(D): A POS
ANTIBODY SCREEN: NEGATIVE
UNIT DIVISION: 0
UNIT DIVISION: 0
Unit division: 0

## 2014-12-17 LAB — CBC
HCT: 24 % — ABNORMAL LOW (ref 36.0–46.0)
Hemoglobin: 8.6 g/dL — ABNORMAL LOW (ref 12.0–15.0)
MCH: 29.7 pg (ref 26.0–34.0)
MCHC: 35.8 g/dL (ref 30.0–36.0)
MCV: 82.8 fL (ref 78.0–100.0)
PLATELETS: 94 10*3/uL — AB (ref 150–400)
RBC: 2.9 MIL/uL — AB (ref 3.87–5.11)
RDW: 15.7 % — ABNORMAL HIGH (ref 11.5–15.5)
WBC: 9.4 10*3/uL (ref 4.0–10.5)

## 2014-12-17 LAB — MAGNESIUM: Magnesium: 1.9 mg/dL (ref 1.5–2.5)

## 2014-12-17 LAB — BASIC METABOLIC PANEL
ANION GAP: 7 (ref 5–15)
BUN: 7 mg/dL (ref 6–23)
CALCIUM: 7.5 mg/dL — AB (ref 8.4–10.5)
CO2: 24 mmol/L (ref 19–32)
Chloride: 107 mmol/L (ref 96–112)
Creatinine, Ser: 0.56 mg/dL (ref 0.50–1.10)
GFR calc Af Amer: >90 mL/min (ref >90)
GFR calc non Af Amer: >90 mL/min (ref >90)
Glucose, Bld: 82 mg/dL (ref 70–99)
Potassium: 3.1 mmol/L — ABNORMAL LOW (ref 3.5–5.1)
Sodium: 138 mmol/L (ref 135–145)

## 2014-12-17 LAB — PHOSPHORUS: Phosphorus: 2.3 mg/dL (ref 2.3–4.6)

## 2014-12-17 MED ORDER — POTASSIUM CHLORIDE CRYS ER 20 MEQ PO TBCR
30.0000 meq | EXTENDED_RELEASE_TABLET | ORAL | Status: AC
  Administered 2014-12-17 (×2): 30 meq via ORAL
  Filled 2014-12-17 (×2): qty 1

## 2014-12-17 MED ORDER — ACETAMINOPHEN 325 MG PO TABS
650.0000 mg | ORAL_TABLET | Freq: Four times a day (QID) | ORAL | Status: DC | PRN
  Administered 2014-12-17: 650 mg via ORAL
  Filled 2014-12-17 (×2): qty 2

## 2014-12-17 MED ORDER — LIDOCAINE 4 % EX CREA
TOPICAL_CREAM | Freq: Every day | CUTANEOUS | Status: DC | PRN
  Filled 2014-12-17: qty 5

## 2014-12-17 MED ORDER — DOXYCYCLINE HYCLATE 100 MG IV SOLR
200.0000 mg | INTRAVENOUS | Status: DC
  Filled 2014-12-17: qty 200

## 2014-12-17 NOTE — Progress Notes (Signed)
PULMONARY / CRITICAL CARE MEDICINE   Name: Jeanette Copeland MRN: [id number] DOB: 06/06/1996    ADMISSION DATE:  12/13/2014 CONSULTATION DATE: 12/13/2014  REFERRING MD : [facility]  CHIEF COMPLAINT: Septic shock  INITIAL PRESENTATION: Patient presented to the ED with vomiting and chills. She was found to be hypotensive, requiring 5L bolus of NS, to keep systolic pressures in 70s. Found to be pregnant on urine pregnancy with confirmation on ultrasound.  STUDIES:  Abdominal US: sludge with concern for cholecystitis Transvaginal US: Single IUP at 16w 1d  SIGNIFICANT EVENTS: 2/23: Patient admitted 2/25: Tissue passing in bedside commode from the vaginal vault.  SUBJECTIVE:  Feels better.  Denies chest/abd pain.  VITAL SIGNS: Temp:  [97.7 F (36.5 C)-98.5 F (36.9 C)] 98.1 F (36.7 C) (02/27 0745) Pulse Rate:  [59-100] 100 (02/27 0900) Resp:  [12-29] 22 (02/27 0900) BP: (78-104)/(50-76) 97/60 mmHg (02/27 0835) SpO2:  [94 %-100 %] 96 % (02/27 0900) Weight:  [111 lb 1.8 oz (50.4 kg)] 111 lb 1.8 oz (50.4 kg) (02/27 0452) INTAKE / OUTPUT:  Intake/Output Summary (Last 24 hours) at 12/17/14 1011 Last data filed at 12/17/14 0829  Gross per 24 hour  Intake  412.5 ml  Output    950 ml  Net -537.5 ml   PHYSICAL EXAMINATION: General: no distress Neuro:normal strength HEENT:no sinus tenderness Cardiovascular:regular tachycardic Lungs:scattered rhonchi Abdomen: Soft, non tender Musculoskeletal: No edema Skin:no rashes  LABS:  CBC  Recent Labs Lab 12/15/14 1103 12/16/14 0500 12/16/14 1600 12/17/14 0423  WBC 16.5* 15.8*  --  9.4  HGB 10.3* 5.9* 8.2* 8.6*  HCT 29.7* 17.0* 23.3* 24.0*  PLT 81* 91*  --  94*   Coag's  Recent Labs Lab 12/15/14 1100  APTT 30  INR 0.99   BMET  Recent Labs Lab 12/14/14 1400 12/16/14 0500 12/17/14 0423  NA 141 135 138  K 3.5 3.8 3.1*  CL 117* 109 107  CO2 17* 20 24  BUN CREATININE 0.88 0.68 0.56  GLUCOSE 84  75 82   Electrolytes  Recent Labs Lab 12/14/14 0451 12/14/14 1400 12/16/14 0500 12/17/14 0423  CALCIUM 6.9* 7.4* 7.2* 7.5*  MG 1.3*  --  1.4* 1.9  PHOS 2.1*  --  2.7 2.3   Sepsis Markers  Recent Labs Lab 12/13/14 1835 12/14/14 1400 12/14/14 1710  LATICACIDVEN 3.23* 0.8 0.8   Liver Enzymes  Recent Labs Lab 12/13/14 1337 12/14/14 0451 12/16/14 0500  AST 92* 87* 43*  ALT 49* 47* 29  ALKPHOS 535* 130* 120*  BILITOT 2.0* 1.0 0.1*  ALBUMIN 2.6* 1.9* 1.7*   Glucose  Recent Labs Lab 12/14/14 0038  GLUCAP 108*   Imaging Dg Chest Port 1 View  12/16/2014   CLINICAL DATA:  Difficulty breathing  EXAM: PORTABLE CHEST - 1 VIEW  COMPARISON:  December 15, 2014  FINDINGS: Central catheter tip is in the right atrium. No pneumothorax. There is bilateral mid and lower lung zone airspace consolidation with effusions. Heart is upper normal with pulmonary vascularity indicative of pulmonary venous hypertension. No new opacity.  IMPRESSION: Findings felt to be consistent with congestive heart failure. Superimposed pneumonia in the lower lung zones cannot be excluded. Both entities may exist concurrently. No new opacity. No pneumothorax.   Electronically Signed   By: Bretta Bang III M.D.   On: 12/16/2014 07:17   ASSESSMENT / PLAN:  Septic shock likely from GYN source with spontaneous abortion. Plan: - KVO IV fluids - d/c CVL -  she will need outpt f/u with Gyn - day 6 of zosyn  AKI >> resolved. Metabolic acidosis >> resolved. Hypokalemia. Plan: - f/u BMET  Hypervolemia with pulmonary edema. Plan: - KVO IV fluids  Cholelithiasis >> evaluated by surgery >> s/o 2/26. Elevated LFT's likely related to shock. Plan: - f/u LFT's  Anemia 2nd to bleeding from spontaneous abortion. Thrombocytopenia likely from sepsis. Plan: - f/u CBC - transfuse for Hb < 7 - check iron studies  Updated family at bedside.  D/w Ob/Gyn.  Will transfer to telemetry 2/27 >> to triad 2/28 and  PCCM sign off.  Coralyn HellingVineet Scarleth Brame, MD [facility] Pulmonary/Critical Care 12/17/2014, 10:18 AM Pager:  [phone] After 3pm call: [phone]

## 2014-12-17 NOTE — Progress Notes (Signed)
eLink Physician-Brief Progress Note Patient Name: Jeanette GheeBreanna Copeland DOB: 10/29/1995 MRN: [id number]   Date of Service  12/17/2014  HPI/Events of Note   Pain at CL removal site.   eICU Interventions   PRN Lidocaine cream      Intervention Category Minor Interventions: Routine modifications to care plan (e.g. PRN medications for pain, fever)  Jeanette Kubicek R. 12/17/2014, 4:45 PM

## 2014-12-17 NOTE — Progress Notes (Signed)
[facility] FOR [facility] LAB REPLACEMENT ONLY  The patient does apply for the [facility] Electrolyte [facility] Protocol based on the criteria listed below:   1. Is GFR >/= 40 ml/min? Yes.    Patient's GFR today is >90 2. Is urine output >/= 0.5 ml/kg/hr for the last 6 hours? Yes.   Patient's UOP is 0.5 ml/kg/hr 3. Is BUN < 60 mg/dL? Yes.    Patient's BUN today is 7 4. Abnormal electrolyte(s): K 3.1 5. Ordered repletion with: [facility] adult ICU replacement protocol 6. If a panic level lab has been reported, has the CCM MD in charge been notified? Yes.  .   Physician:  Dr. Lanora ManisElizabeth Deterding  [facility] EastRAMZAH, Jeanette BertholdYOUNKAI Copeland 12/17/2014 6:06 AM

## 2014-12-17 NOTE — Progress Notes (Signed)
Patient transferred from 36M Oriented to room. Skin intact, placed on tele 24.

## 2014-12-17 NOTE — Progress Notes (Addendum)
Post partum day 2 s/p 16 wk spont ab associated with sepsis.  S/p transfusion x 2 units. prbc.  Subjective: Patient reports minimal bleeding. Desires info on birth control Pt's mother is cheerfully controlling, and interferes with direct conversation with patient, who shuts down rather than compete with mother.   Extensive effort to discuss contraception. Pt's mom says pt is interested in photo of baby from admission u/s; family referred to medical records Pt's mom says pt will be takin rest of semester off, and she is advised to take discharge info to [employer] to obtain necessary forms for medical LOA from school Objective: I have reviewed patient's vital signs, intake and output, medications and labs. Pathology on tissue /poc's not back yet.  General: alert, fatigued and no distress Vaginal Bleeding: minimal CBC Latest Ref Rng 12/17/2014 12/16/2014 12/16/2014  WBC 4.0 - 10.5 K/uL 9.4 - 15.8(H)  Hemoglobin 12.0 - 15.0 g/dL 4.0(J8.6(L) 8.1(X8.2(L) 5.9(LL)  Hematocrit 36.0 - 46.0 % 24.0(L) 23.3(L) 17.0(L)  Platelets 150 - 400 K/uL 94(L) - 91(L)       Assessment/Plan: Ppd 2 s/p spont ab associated with sepsis ,  improving Anemia., s/p transfusion 2 u prbc. Contraception need. Likely to use Nexplanon   Plan: 1. Iron supplement at discharge          [facility]. [facility] for pp visit [phone]           3. Pt requests note for leave of absence from school         4   Gyn service tosign off.  Please have pt call for appt, [facility]  LOS: 4 days  Over 25 mins spent in direct pt contact. counselling and coordinating care.  Kielyn Kardell V 12/17/2014, 10:09 AM

## 2014-12-17 NOTE — Progress Notes (Signed)
eLink Physician-Brief Progress Note Patient Name: Albertha GheeBreanna Sweetin DOB: 07/07/1996 MRN: [id number]   Date of Service  12/17/2014  HPI/Events of Note   Patient with mild abdominal pain. Mother concerned because had GB wall thickening on recent US. Evaluated by surgery.   eICU Interventions   No further Action      Intervention Category Minor Interventions: Routine modifications to care plan (e.g. PRN medications for pain, fever)  Chasya Zenz R. 12/17/2014, 7:05 PM

## 2014-12-18 ENCOUNTER — Inpatient Hospital Stay (HOSPITAL_COMMUNITY): Payer: Medicaid Other

## 2014-12-18 ENCOUNTER — Encounter (HOSPITAL_COMMUNITY): Payer: Self-pay | Admitting: Radiology

## 2014-12-18 LAB — BASIC METABOLIC PANEL
Anion gap: 7 (ref 5–15)
CHLORIDE: 105 mmol/L (ref 96–112)
CO2: 28 mmol/L (ref 19–32)
Calcium: 8.3 mg/dL — ABNORMAL LOW (ref 8.4–10.5)
Creatinine, Ser: 0.59 mg/dL (ref 0.50–1.10)
GFR calc Af Amer: >90 mL/min (ref >90)
Glucose, Bld: 76 mg/dL (ref 70–99)
Potassium: 3.4 mmol/L — ABNORMAL LOW (ref 3.5–5.1)
SODIUM: 140 mmol/L (ref 135–145)

## 2014-12-18 LAB — CBC
HCT: 26.3 % — ABNORMAL LOW (ref 36.0–46.0)
Hemoglobin: 8.9 g/dL — ABNORMAL LOW (ref 12.0–15.0)
MCH: 28.6 pg (ref 26.0–34.0)
MCHC: 33.8 g/dL (ref 30.0–36.0)
MCV: 84.6 fL (ref 78.0–100.0)
Platelets: 177 10*3/uL (ref 150–400)
RBC: 3.11 MIL/uL — ABNORMAL LOW (ref 3.87–5.11)
RDW: 15.9 % — ABNORMAL HIGH (ref 11.5–15.5)
WBC: 12.3 10*3/uL — AB (ref 4.0–10.5)

## 2014-12-18 LAB — CULTURE, BLOOD (ROUTINE X 2)

## 2014-12-18 LAB — MAGNESIUM: Magnesium: 1.5 mg/dL (ref 1.5–2.5)

## 2014-12-18 MED ORDER — CEFTRIAXONE SODIUM IN DEXTROSE 40 MG/ML IV SOLN
2.0000 g | INTRAVENOUS | Status: DC
  Administered 2014-12-18 – 2014-12-19 (×2): 2 g via INTRAVENOUS
  Filled 2014-12-18 (×3): qty 50

## 2014-12-18 MED ORDER — FERROUS SULFATE 325 (65 FE) MG PO TABS
325.0000 mg | ORAL_TABLET | Freq: Two times a day (BID) | ORAL | Status: DC
  Administered 2014-12-18 – 2014-12-20 (×3): 325 mg via ORAL
  Filled 2014-12-18 (×6): qty 1

## 2014-12-18 MED ORDER — DIPHENHYDRAMINE HCL 25 MG PO CAPS
25.0000 mg | ORAL_CAPSULE | Freq: Four times a day (QID) | ORAL | Status: DC | PRN
  Administered 2014-12-18: 25 mg via ORAL
  Filled 2014-12-18: qty 1

## 2014-12-18 NOTE — Progress Notes (Signed)
PATIENT DETAILS Name: Jeanette Copeland Age: 19 y.o. Sex: female Date of Birth: 11/16/1995 Admit Date: 12/13/2014 Admitting Physician Nelda Bucksaniel J Feinstein, MD PCP:No primary care provider on file.  Subjective: Had RUQ pain last evening. Currently with no major complaints. Mother at bedside-plan discussed with patient in presence of mother (after patient gave consent)  Assessment/Plan: Active Problems:   Septic shock: Secondary to Citrobacter Koseri bacteremia. Septic shock has resolved. She required admission to the intensive care unit, required IV fluid resuscitation and transient pressor support. Currently on Zosyn, stop and narrow to Rocephin.    Citrobacter bacteremia: Blood cultures on 2/23 positive for Citrobacter. She presented with right upper quadrant abdominal pain, abdominal ultrasound was positive for cholelithiasis/sludge. Subsequently on 2/25, patient spontaneously aborted (unaware she was pregnant on admission). Question of whether this bacteremia is from GI versus GYN source remains. Repeat pelvic ultrasound on 2/25 shows no intrauterine pregnancy, there was no evidence of retained products or any endometritis. She continues to have intermittent RUQ pain-? Biliary colic-on exam-no RUQ tenderness or Murphy's sign present. Will check HIDA scan, if positive will reconsult surgery, if negative will transition to ciprofloxacin and discharge    Spontaneous abortion: Patient initially was apparently unaware she was pregnant on admission. However urine pregnancy test was positive, a OB transvaginal ultrasound was positive for intrauterine pregnancy with gestational age of around 8216 weeks and 1 day. Unfortunately, patient had a spontaneous abortion on 2/25. OB was consulted while patient was in the intensive care unit. A repeat pelvic ultrasound done on 2/25 did not show any retained products. See subsequently seen by OB on 2/27, no further recommendations apart from iron supplementation  and GYN follow-up on discharge.    Acute blood loss anemia: Likely secondary to bleeding from spontaneous abortion. Transfused 2 units of PRBC, CBC currently stable. Continue to follow periodically.    Cholelithiasis: Admitted with RUQ pain, abdominal ultrasound showed cholelithiasis with sludge. LFTs elevated, not sure if this is from shock liver or from cholecystitis. Although much improved, continues to have intermittent RUQ pain. Will check HIDA scan, if positive will reconsult surgery.    Thrombocytopenia: From septic shock, resolved.  Disposition: Remain inpatient  Antibiotics:  See below   Anti-infectives    Start     Dose/Rate Route Frequency Ordered Stop   12/18/14 1315  cefTRIAXone (ROCEPHIN) 2 g in dextrose 5 % 50 mL IVPB - Premix     2 g 100 mL/hr over 30 Minutes Intravenous Every 24 hours 12/18/14 1311     12/18/14 0600  doxycycline (VIBRAMYCIN) 200 mg in dextrose 5 % 250 mL IVPB     200 mg 125 mL/hr over 120 Minutes Intravenous 60 min pre-op 12/17/14 1704     12/14/14 0100  piperacillin-tazobactam (ZOSYN) IVPB 3.375 g  Status:  Discontinued     3.375 g 12.5 mL/hr over 240 Minutes Intravenous Every 8 hours 12/13/14 2029 12/18/14 1311   12/13/14 2030  piperacillin-tazobactam (ZOSYN) IVPB 3.375 g  Status:  Discontinued     3.375 g 100 mL/hr over 30 Minutes Intravenous  Once 12/13/14 2027 12/13/14 2029   12/13/14 1545  piperacillin-tazobactam (ZOSYN) IVPB 2.25 g     2.25 g 100 mL/hr over 30 Minutes Intravenous  Once 12/13/14 1539 12/13/14 2020      DVT Prophylaxis: SCD's  Code Status: Full code   Family Communication Mother at bedside  Procedures:  None  CONSULTS:  pulmonary/intensive care and OB  Time spent  40 minutes-which includes 50% of the time with face-to-face with patient/ family and coordinating care related to the above assessment and plan.  MEDICATIONS: Scheduled Meds: . cefTRIAXone (ROCEPHIN)  IV  2 g Intravenous Q24H  . doxycycline  (VIBRAMYCIN) IV  200 mg Intravenous 60 min Pre-Op   Continuous Infusions:  PRN Meds:.acetaminophen, lidocaine, ondansetron (ZOFRAN) IV, oxyCODONE-acetaminophen    PHYSICAL EXAM: Vital signs in last 24 hours: Filed Vitals:   12/17/14 1600 12/17/14 1702 12/17/14 2154 12/18/14 0555  BP:  110/78 111/78 100/56  Pulse: 83 82 69   Temp:  99.3 F (37.4 C) 98.4 F (36.9 C) 98.3 F (36.8 C)  TempSrc:  Oral Oral Oral  Resp: Height:      Weight:    51.347 kg (113 lb 3.2 oz)  SpO2: 100% 100% 98% 98%    Weight change: 0.947 kg (2 lb 1.4 oz) Filed Weights   12/16/14 0500 12/17/14 0452 12/18/14 0555  Weight: 51 kg (112 lb 7 oz) 50.4 kg (111 lb 1.8 oz) 51.347 kg (113 lb 3.2 oz)   Body mass index is 18.84 kg/(m^2).   Gen Exam: Awake and alert with clear speech.   Neck: Supple, No JVD.   Chest: B/L Clear.   CVS: S1 S2 Regular, no murmurs.  Abdomen: soft, BS +, non tender, non distended.  Extremities: no edema, lower extremities warm to touch. Neurologic: Non Focal.   Skin: No Rash.   Wounds: N/A.    Intake/Output from previous day:  Intake/Output Summary (Last 24 hours) at 12/18/14 1311 Last data filed at 12/18/14 1219  Gross per 24 hour  Intake     50 ml  Output   1950 ml  Net  -1900 ml     LAB RESULTS: CBC  Recent Labs Lab 12/13/14 1337  12/14/14 0451 12/15/14 1100 12/15/14 1103 12/16/14 0500 12/16/14 1600 12/17/14 0423 12/18/14 0736  WBC 10.2  --  23.2*  --  16.5* 15.8*  --  9.4 12.3*  HGB 11.5*  < > 10.0*  --  10.3* 5.9* 8.2* 8.6* 8.9*  HCT 33.0*  < > 28.4*  --  29.7* 17.0* 23.3* 24.0* 26.3*  PLT 111*  --  88* 83* 81* 91*  --  94* 177  MCV 84.0  --  82.3  --  83.9 83.3  --  82.8 84.6  MCH 29.3  --  29.0  --  29.1 28.9  --  29.7 28.6  MCHC 34.8  --  35.2  --  34.7 34.7  --  35.8 33.8  RDW 15.2  --  15.8*  --  16.7* 16.3*  --  15.7* 15.9*  LYMPHSABS 0.3*  --   --   --   --   --   --   --   --   MONOABS 0.1  --   --   --   --   --   --   --   --     EOSABS 0.0  --   --   --   --   --   --   --   --   BASOSABS 0.0  --   --   --   --   --   --   --   --   < > = values in this interval not displayed.  Chemistries   Recent Labs Lab 12/14/14 0451 12/14/14 1400 12/16/14 0500 12/17/14 0423 12/18/14 0736  NA 137 141 135  138 140  K 3.1* 3.5 3.8 3.1* 3.4*  CL 114* 117* 109 107 105  CO2 14* 17* 20 24 28   GLUCOSE 94 84 75 82 76  BUN 15 13 13 7  <5*  CREATININE 1.09 0.88 0.68 0.56 0.59  CALCIUM 6.9* 7.4* 7.2* 7.5* 8.3*  MG 1.3*  --  1.4* 1.9 1.5    CBG:  Recent Labs Lab 12/14/14 0038  GLUCAP 108*    GFR Estimated Creatinine Clearance: 90.8 mL/min (by C-G formula based on Cr of 0.59).  Coagulation profile  Recent Labs Lab 12/15/14 1100  INR 0.99    Cardiac Enzymes No results for input(s): CKMB, TROPONINI, MYOGLOBIN in the last 168 hours.  Invalid input(s): CK  Invalid input(s): POCBNP No results for input(s): DDIMER in the last 72 hours. No results for input(s): HGBA1C in the last 72 hours. No results for input(s): CHOL, HDL, LDLCALC, TRIG, CHOLHDL, LDLDIRECT in the last 72 hours. No results for input(s): TSH, T4TOTAL, T3FREE, THYROIDAB in the last 72 hours.  Invalid input(s): FREET3 No results for input(s): VITAMINB12, FOLATE, FERRITIN, TIBC, IRON, RETICCTPCT in the last 72 hours. No results for input(s): LIPASE, AMYLASE in the last 72 hours.  Urine Studies No results for input(s): UHGB, CRYS in the last 72 hours.  Invalid input(s): UACOL, UAPR, USPG, UPH, UTP, UGL, UKET, UBIL, UNIT, UROB, ULEU, UEPI, UWBC, URBC, UBAC, CAST, UCOM, BILUA  MICROBIOLOGY: Recent Results (from the past 240 hour(s))  Wet prep, genital     Status: Abnormal   Collection Time: 12/13/14  4:51 PM  Result Value Ref Range Status   Yeast Wet Prep HPF POC FEW (A) NONE SEEN Final   Trich, Wet Prep NONE SEEN NONE SEEN Final   Clue Cells Wet Prep HPF POC NONE SEEN NONE SEEN Final   WBC, Wet Prep HPF POC FEW (A) NONE SEEN Final  Culture,  blood (routine x 2)     Status: None (Preliminary result)   Collection Time: 12/13/14  8:10 PM  Result Value Ref Range Status   Specimen Description BLOOD LEFT ARM  Final   Special Requests BOTTLES DRAWN AEROBIC AND ANAEROBIC 5CC  Final   Culture   Final           BLOOD CULTURE RECEIVED NO GROWTH TO DATE CULTURE WILL BE HELD FOR 5 DAYS BEFORE ISSUING A FINAL NEGATIVE REPORT Performed at [facility]    Report Status PENDING  Incomplete  Culture, blood (routine x 2)     Status: None   Collection Time: 12/13/14  8:17 PM  Result Value Ref Range Status   Specimen Description BLOOD LEFT HAND  Final   Special Requests BOTTLES DRAWN AEROBIC AND ANAEROBIC 2CC  Final   Culture   Final    CITROBACTER KOSERI Note: Gram Stain Report Called to,Read Back By and Verified With: JENNIFER BISHOP RN 12/16/14 AT 1125 AM BY [facility] Performed at [facility]    Report Status 12/18/2014 FINAL  Final   Organism ID, Bacteria CITROBACTER KOSERI  Final      Susceptibility   Citrobacter koseri - MIC*    CEFAZOLIN <=4 SENSITIVE Sensitive     CEFEPIME <=1 SENSITIVE Sensitive     CEFTAZIDIME <=1 SENSITIVE Sensitive     CEFTRIAXONE <=1 SENSITIVE Sensitive     CIPROFLOXACIN <=0.25 SENSITIVE Sensitive     GENTAMICIN <=1 SENSITIVE Sensitive     IMIPENEM <=0.25 SENSITIVE Sensitive     PIP/TAZO <=4 SENSITIVE Sensitive     TOBRAMYCIN <=  1 SENSITIVE Sensitive     TRIMETH/SULFA <=20 SENSITIVE Sensitive     * CITROBACTER KOSERI  Urine culture     Status: None   Collection Time: 12/13/14  8:57 PM  Result Value Ref Range Status   Specimen Description URINE, CLEAN CATCH  Final   Special Requests NONE  Final   Colony Count   Final    >=100,000 COLONIES/ML Performed at [facility]    Culture   Final    Multiple bacterial morphotypes present, none predominant. Suggest appropriate recollection if clinically indicated. Performed at [facility]    Report Status 12/14/2014 FINAL  Final    MRSA PCR Screening     Status: None   Collection Time: 12/13/14 11:00 PM  Result Value Ref Range Status   MRSA by PCR NEGATIVE NEGATIVE Final    Comment:        The GeneXpert MRSA Assay (FDA approved for NASAL specimens only), is one component of a comprehensive MRSA colonization surveillance program. It is not intended to diagnose MRSA infection nor to guide or monitor treatment for MRSA infections.     RADIOLOGY STUDIES/RESULTS: US Abdomen Complete  12/13/2014   CLINICAL DATA:  Right upper quadrant pain, nausea  EXAM: ULTRASOUND ABDOMEN COMPLETE  COMPARISON:  None.  FINDINGS: Gallbladder: Gallbladder sludge and a gallstone is noted within gallbladder measures 1.1 cm. Mild thickening of gallbladder wall up to 3.4 mm. Although there is no sonographic Murphy's sign, early cholecystitis cannot be excluded. Clinical correlation is necessary.  Common bile duct: Diameter: 2.8 mm in diameter within normal limits.  Liver: No focal lesion identified. Within normal limits in parenchymal echogenicity.  IVC: No abnormality visualized.  Pancreas: Visualized portion unremarkable.  Spleen: Size and appearance within normal limits. Measures 10.2 cm in length. Accessory splenule measures 1.8 x 1.7 cm.  Right Kidney: Length: 11.9 cm. Echogenicity within normal limits. No mass or hydronephrosis visualized.  Left Kidney: Length: 11.7 cm. Echogenicity within normal limits. No mass or hydronephrosis visualized.  Abdominal aorta: No aneurysm visualized. Measures up to 1.6 cm in diameter.  Other findings: None.  IMPRESSION: 1. Gallbladder sludge and a gallstone is noted within gallbladder measures 1.1 cm. Mild thickening of gallbladder wall up to 3.4 mm. Although there is no sonographic Murphy's sign, early cholecystitis cannot be excluded. Clinical correlation is necessary. 2. Normal CBD. 3. No hydronephrosis.   Electronically Signed   By: Natasha Mead M.D.   On: 12/13/2014 17:44   US Ob Transvaginal  12/13/2014    CLINICAL DATA:  Evaluate dates.  On known last menstrual.  EXAM: LIMITED OBSTETRIC ULTRASOUND  FINDINGS: Number of Fetuses: 1  Heart Rate:  150 bpm  Movement: Yes  Presentation: Cephalic  Placental Location: Anterior  Previa: No  Amniotic Fluid (Subjective):  Normal  BPD:  3.3cm 16w  1d  MATERNAL FINDINGS:  Cervix:  Appears closed.  Uterus/Adnexae:  No abnormality visualized.  IMPRESSION: 1. Single living intrauterine gestation. The estimated gestational age is 16 weeks and 1 day.  This exam is performed on an emergent basis and does not comprehensively evaluate fetal size, dating, or anatomy; follow-up complete OB US should be considered if further fetal assessment is warranted.   Electronically Signed   By: Signa Kell M.D.   On: 12/13/2014 19:02   US Pelvis Complete  12/16/2014   CLINICAL DATA:  Recent spontaneous abortion. Evaluate for retained products.  EXAM: TRANSABDOMINAL ULTRASOUND OF PELVIS  TECHNIQUE: Transabdominal ultrasound examination of the pelvis was  performed including evaluation of the uterus, ovaries, adnexal regions, and pelvic cul-de-sac.  COMPARISON:  12/13/2014  FINDINGS: Uterus  Measurements: 12.0 x 7.1 x 7.3 cm. No fibroids or other mass visualized.  Endometrium  Thickness: 0.8 cm. Endometrial stripe is mildly heterogeneous. No significant fluid within the endometrial cavity.  Right ovary  Measurements: Not visualized.  Left ovary  Measurements: 3.1 x 1.7 x 1.6 cm. Normal appearance with a small follicle.  Other findings:  Small amount of fluid in the cul-de-sac.  IMPRESSION: No evidence for an intrauterine pregnancy. Findings consistent with recent spontaneous abortion.  Endometrial stripe is mildly heterogeneous without gross abnormality. No clear evidence for retained products.  Small amount of free fluid.  Right ovary is not visualized.   Electronically Signed   By: Richarda Overlie M.D.   On: 12/16/2014 08:27   Dg Chest Port 1 View  12/18/2014   CLINICAL DATA:  Pulmonary edema.   EXAM: PORTABLE CHEST - 1 VIEW  COMPARISON:  Radiograph 12/16/2014  FINDINGS: Interval removal of right central venous line. Normal cardiac silhouette. There is improvement bilateral pleural effusions. Bibasilar airspace disease is small effusions to remain. No pneumothorax.  IMPRESSION: Improvement in pulmonary edema and pleural effusions.   Electronically Signed   By: Genevive Bi M.D.   On: 12/18/2014 09:46   Dg Chest Port 1 View  12/16/2014   CLINICAL DATA:  Difficulty breathing  EXAM: PORTABLE CHEST - 1 VIEW  COMPARISON:  December 15, 2014  FINDINGS: Central catheter tip is in the right atrium. No pneumothorax. There is bilateral mid and lower lung zone airspace consolidation with effusions. Heart is upper normal with pulmonary vascularity indicative of pulmonary venous hypertension. No new opacity.  IMPRESSION: Findings felt to be consistent with congestive heart failure. Superimposed pneumonia in the lower lung zones cannot be excluded. Both entities may exist concurrently. No new opacity. No pneumothorax.   Electronically Signed   By: Bretta Bang III M.D.   On: 12/16/2014 07:17   Dg Chest Port 1 View  12/15/2014   CLINICAL DATA:  Acute pulmonary edema, septic shock.  EXAM: PORTABLE CHEST - 1 VIEW  COMPARISON:  Portable chest x-ray of December 14, 2014  FINDINGS: The lung volumes remain low. Moderate-sized pleural effusions persist layering posteriorly. The pulmonary interstitial markings remain increased. The pulmonary vascularity remains engorged. The cardiac silhouette is largely obscured. The mediastinum is normal in width. The right internal jugular venous catheter tip projects over the distal portion of the SVC.  IMPRESSION: Persistent bilateral pleural effusions and bilateral pulmonary interstitial edema consistent with CHF. There has been slight interval deterioration since yesterday's study.   Electronically Signed   By: David  [place]   On: 12/15/2014 07:45   Dg Chest Port 1  View  12/14/2014   CLINICAL DATA:  Septic shock. Central line placement. Sixteen weeks pregnant.  EXAM: PORTABLE CHEST - 1 VIEW  COMPARISON:  None.  FINDINGS: Right central venous catheter. Tip is at the level of the cavoatrial junction. No pneumothorax. Normal heart size and pulmonary vascularity. Bilateral perihilar basilar infiltrates. Possible pleural effusions. No pneumothorax.  IMPRESSION: Right central venous catheter appears in satisfactory position. No pneumothorax. Basal perihilar infiltrates bilaterally with possible pleural effusions.   Electronically Signed   By: Burman Nieves M.D.   On: 12/14/2014 00:59    Jeoffrey Massed, MD  [facility] Pager:[phone]  If 7PM-7AM, please contact night-coverage [email or web address] Password [email or web address] 12/18/2014, 1:11 PM   LOS: 5 days

## 2014-12-18 NOTE — Progress Notes (Signed)
UR Completed.  [phone]  

## 2014-12-18 NOTE — Progress Notes (Signed)
Dr. Jerral RalphGhimire paged and notifed that pt. Refused to let lab draw second set of blood cultures.

## 2014-12-19 ENCOUNTER — Inpatient Hospital Stay (HOSPITAL_COMMUNITY): Payer: Medicaid Other

## 2014-12-19 LAB — COMPREHENSIVE METABOLIC PANEL
ALT: 46 U/L — ABNORMAL HIGH (ref 0–35)
ANION GAP: 7 (ref 5–15)
AST: 53 U/L — ABNORMAL HIGH (ref 0–37)
Albumin: 2.4 g/dL — ABNORMAL LOW (ref 3.5–5.2)
Alkaline Phosphatase: 130 U/L — ABNORMAL HIGH (ref 39–117)
BILIRUBIN TOTAL: 0.2 mg/dL — AB (ref 0.3–1.2)
CHLORIDE: 103 mmol/L (ref 96–112)
CO2: 27 mmol/L (ref 19–32)
CREATININE: 0.58 mg/dL (ref 0.50–1.10)
Calcium: 8.6 mg/dL (ref 8.4–10.5)
GFR calc Af Amer: >90 mL/min (ref >90)
GFR calc non Af Amer: >90 mL/min (ref >90)
Glucose, Bld: 90 mg/dL (ref 70–99)
Potassium: 3.8 mmol/L (ref 3.5–5.1)
Sodium: 137 mmol/L (ref 135–145)
Total Protein: 5.4 g/dL — ABNORMAL LOW (ref 6.0–8.3)

## 2014-12-19 LAB — CBC
HCT: 26.7 % — ABNORMAL LOW (ref 36.0–46.0)
Hemoglobin: 9.1 g/dL — ABNORMAL LOW (ref 12.0–15.0)
MCH: 28.8 pg (ref 26.0–34.0)
MCHC: 34.1 g/dL (ref 30.0–36.0)
MCV: 84.5 fL (ref 78.0–100.0)
PLATELETS: 241 10*3/uL (ref 150–400)
RBC: 3.16 MIL/uL — ABNORMAL LOW (ref 3.87–5.11)
RDW: 15.4 % (ref 11.5–15.5)
WBC: 13.7 10*3/uL — ABNORMAL HIGH (ref 4.0–10.5)

## 2014-12-19 MED ORDER — MORPHINE SULFATE 2 MG/ML IJ SOLN: 2.0000 mg | Freq: Once | INTRAMUSCULAR | Status: DC

## 2014-12-19 MED ORDER — MORPHINE SULFATE 4 MG/ML IJ SOLN
INTRAMUSCULAR | Status: AC
  Administered 2014-12-19: 2 mg
  Filled 2014-12-19: qty 1

## 2014-12-19 MED ORDER — TECHNETIUM TC 99M MEBROFENIN IV KIT: 5.0000 | PACK | Freq: Once | INTRAVENOUS | Status: AC | PRN

## 2014-12-19 NOTE — Progress Notes (Addendum)
PATIENT DETAILS Name: Jeanette Copeland Age: 19 y.o. Sex: female Date of Birth: 11/01/1995 Admit Date: 12/13/2014 Admitting Physician Nelda Bucks, MD [id number], Leanora Cover, MD  Subjective: No RUQ since the day before. Tolerating diet well. No other complaints. Mother at bedside.   Assessment/Plan: Active Problems:   Septic shock: Secondary to Citrobacter Koseri bacteremia. Septic shock has resolved. She required admission to the intensive care unit, required IV fluid resuscitation and transient pressor support. Was on Zosyn, narrowed to Rocephin on 2/28.    Citrobacter bacteremia: Blood cultures on 2/23 positive for Citrobacter. She presented with right upper quadrant abdominal pain, abdominal ultrasound was positive for cholelithiasis/sludge. Subsequently on 2/25, patient spontaneously aborted (unaware she was pregnant on admission). Question of whether this bacteremia is from GI versus GYN source remains. Repeat pelvic ultrasound on 2/25 shows no intrauterine pregnancy, there was no evidence of retained products or any endometritis. She did have  intermittent RUQ pain on 2/27-? Biliary colic-on exam-no RUQ tenderness or Murphy's sign present. No pain since 2/2/7.Await  HIDA scan, if positive will reconsult surgery, if negative will transition to ciprofloxacin and discharge. Note-abdominal exam is completely benign on exam with NO RUQ tenderness. Repeat Blood culture on 2/28 neg so far.     Spontaneous abortion: Patient initially was apparently unaware she was pregnant on admission. However urine pregnancy test was positive, a OB transvaginal ultrasound was positive for intrauterine pregnancy with gestational age of around 23 weeks and 1 day. Unfortunately, patient had a spontaneous abortion on 2/25. OB was consulted while patient was in the intensive care unit. A repeat pelvic ultrasound done on 2/25 did not show any retained products. See subsequently seen by OB on 2/27, no further  recommendations apart from iron supplementation and GYN follow-up on discharge.    Acute blood loss anemia: Likely secondary to bleeding from spontaneous abortion. Transfused 2 units of PRBC, CBC currently stable. Continue to follow periodically.    Cholelithiasis: Admitted with RUQ pain, abdominal ultrasound showed cholelithiasis with sludge. LFTs elevated, not sure if this is from shock liver or from cholecystitis. No further RUQ pain, if HIDA negative, can pursue outpatient cholecystectomy.     Thrombocytopenia: From septic shock, resolved.     Leukocytosis: mild leukocytosis persists-suspect ongoing inflammation from recent spontaneous abortion likely causing this. But await HIDA.  Disposition: Remain inpatient-?home if HIDA scan is negative. Antibiotics:  See below   Anti-infectives    Start     Dose/Rate Route Frequency Ordered Stop   12/18/14 1400  cefTRIAXone (ROCEPHIN) 2 g in dextrose 5 % 50 mL IVPB - Premix     2 g 100 mL/hr over 30 Minutes Intravenous Every 24 hours 12/18/14 1311     12/18/14 0600  doxycycline (VIBRAMYCIN) 200 mg in dextrose 5 % 250 mL IVPB     200 mg 125 mL/hr over 120 Minutes Intravenous 60 min pre-op 12/17/14 1704     12/14/14 0100  piperacillin-tazobactam (ZOSYN) IVPB 3.375 g  Status:  Discontinued     3.375 g 12.5 mL/hr over 240 Minutes Intravenous Every 8 hours 12/13/14 2029 12/18/14 1311   12/13/14 2030  piperacillin-tazobactam (ZOSYN) IVPB 3.375 g  Status:  Discontinued     3.375 g 100 mL/hr over 30 Minutes Intravenous  Once 12/13/14 2027 12/13/14 2029   12/13/14 1545  piperacillin-tazobactam (ZOSYN) IVPB 2.25 g     2.25 g 100 mL/hr over 30 Minutes Intravenous  Once 12/13/14 1539 12/13/14 2020  DVT Prophylaxis: SCD's  Code Status: Full code   Family Communication Mother at bedside  Procedures:  None  CONSULTS:  pulmonary/intensive care and OB  MEDICATIONS: Scheduled Meds: . cefTRIAXone (ROCEPHIN)  IV  2 g Intravenous Q24H    . doxycycline (VIBRAMYCIN) IV  200 mg Intravenous 60 min Pre-Op  . ferrous sulfate  325 mg Oral BID WC  .  morphine injection  2 mg Intravenous Once   Continuous Infusions:  PRN Meds:.acetaminophen, diphenhydrAMINE, lidocaine, ondansetron (ZOFRAN) IV, oxyCODONE-acetaminophen    PHYSICAL EXAM: Vital signs in last 24 hours: Filed Vitals:   12/18/14 1551 12/18/14 2048 12/18/14 2220 12/19/14 0515  BP: 103/69 100/65  96/58  Pulse: 101 75  80  Temp: 98.7 F (37.1 C) 99.3 F (37.4 C) 98.5 F (36.9 C) 98.7 F (37.1 C)  TempSrc: Oral Oral Oral Oral  Resp: 16 18  18   Height:      Weight:    53.661 kg (118 lb 4.8 oz)  SpO2: 98% 98%  98%    Weight change: 2.313 kg (5 lb 1.6 oz) Filed Weights   12/17/14 0452 12/18/14 0555 12/19/14 0515  Weight: 50.4 kg (111 lb 1.8 oz) 51.347 kg (113 lb 3.2 oz) 53.661 kg (118 lb 4.8 oz)   Body mass index is 19.69 kg/(m^2).   Gen Exam: Awake and alert with clear speech.   Neck: Supple, No JVD.   Chest: B/L Clear.   CVS: S1 S2 Regular, no murmurs.  Abdomen: soft, BS +, non tender, non distended. NO RUQ tenderness Extremities: no edema, lower extremities warm to touch. Neurologic: Non Focal.   Skin: No Rash.   Wounds: N/A.    Intake/Output from previous day:  Intake/Output Summary (Last 24 hours) at 12/19/14 1203 Last data filed at 12/19/14 1100  Gross per 24 hour  Intake      0 ml  Output   2900 ml  Net  -2900 ml     LAB RESULTS: CBC  Recent Labs Lab 12/13/14 1337  12/15/14 1103 12/16/14 0500 12/16/14 1600 12/17/14 0423 12/18/14 0736 12/19/14 0605  WBC 10.2  < > 16.5* 15.8*  --  9.4 12.3* 13.7*  HGB 11.5*  < > 10.3* 5.9* 8.2* 8.6* 8.9* 9.1*  HCT 33.0*  < > 29.7* 17.0* 23.3* 24.0* 26.3* 26.7*  PLT 111*  < > 81* 91*  --  94* 177 241  MCV 84.0  < > 83.9 83.3  --  82.8 84.6 84.5  MCH 29.3  < > 29.1 28.9  --  29.7 28.6 28.8  MCHC 34.8  < > 34.7 34.7  --  35.8 33.8 34.1  RDW 15.2  < > 16.7* 16.3*  --  15.7* 15.9* 15.4  LYMPHSABS  0.3*  --   --   --   --   --   --   --   MONOABS 0.1  --   --   --   --   --   --   --   EOSABS 0.0  --   --   --   --   --   --   --   BASOSABS 0.0  --   --   --   --   --   --   --   < > = values in this interval not displayed.  Chemistries   Recent Labs Lab 12/14/14 0451 12/14/14 1400 12/16/14 0500 12/17/14 0423 12/18/14 0736 12/19/14 0605  NA 137 141 135 138 140 137  K 3.1* 3.5 3.8 3.1* 3.4* 3.8  CL 114* 117* 109 107 105 103  CO2 14* 17* GLUCOSE 94 84 75 82 76 90  BUN <5* <5*  CREATININE 1.09 0.88 0.68 0.56 0.59 0.58  CALCIUM 6.9* 7.4* 7.2* 7.5* 8.3* 8.6  MG 1.3*  --  1.4* 1.9 1.5  --     CBG:  Recent Labs Lab 12/14/14 0038  GLUCAP 108*    GFR Estimated Creatinine Clearance: 95.1 mL/min (by C-G formula based on Cr of 0.58).  Coagulation profile  Recent Labs Lab 12/15/14 1100  INR 0.99    Cardiac Enzymes No results for input(s): CKMB, TROPONINI, MYOGLOBIN in the last 168 hours.  Invalid input(s): CK  Invalid input(s): POCBNP No results for input(s): DDIMER in the last 72 hours. No results for input(s): HGBA1C in the last 72 hours. No results for input(s): CHOL, HDL, LDLCALC, TRIG, CHOLHDL, LDLDIRECT in the last 72 hours. No results for input(s): TSH, T4TOTAL, T3FREE, THYROIDAB in the last 72 hours.  Invalid input(s): FREET3 No results for input(s): VITAMINB12, FOLATE, FERRITIN, TIBC, IRON, RETICCTPCT in the last 72 hours. No results for input(s): LIPASE, AMYLASE in the last 72 hours.  Urine Studies No results for input(s): UHGB, CRYS in the last 72 hours.  Invalid input(s): UACOL, UAPR, USPG, UPH, UTP, UGL, UKET, UBIL, UNIT, UROB, ULEU, UEPI, UWBC, URBC, UBAC, CAST, UCOM, BILUA  MICROBIOLOGY: Recent Results (from the past 240 hour(s))  Wet prep, genital     Status: Abnormal   Collection Time: 12/13/14  4:51 PM  Result Value Ref Range Status   Yeast Wet Prep HPF POC FEW (A) NONE SEEN Final   Trich, Wet Prep NONE SEEN  NONE SEEN Final   Clue Cells Wet Prep HPF POC NONE SEEN NONE SEEN Final   WBC, Wet Prep HPF POC FEW (A) NONE SEEN Final  Culture, blood (routine x 2)     Status: None (Preliminary result)   Collection Time: 12/13/14  8:10 PM  Result Value Ref Range Status   Specimen Description BLOOD LEFT ARM  Final   Special Requests BOTTLES DRAWN AEROBIC AND ANAEROBIC 5CC  Final   Culture   Final           BLOOD CULTURE RECEIVED NO GROWTH TO DATE CULTURE WILL BE HELD FOR 5 DAYS BEFORE ISSUING A FINAL NEGATIVE REPORT Performed at [facility]    Report Status PENDING  Incomplete  Culture, blood (routine x 2)     Status: None   Collection Time: 12/13/14  8:17 PM  Result Value Ref Range Status   Specimen Description BLOOD LEFT HAND  Final   Special Requests BOTTLES DRAWN AEROBIC AND ANAEROBIC 2CC  Final   Culture   Final    CITROBACTER KOSERI Note: Gram Stain Report Called to,Read Back By and Verified With: JENNIFER BISHOP RN 12/16/14 AT 1125 AM BY [facility] Performed at [facility]    Report Status 12/18/2014 FINAL  Final   Organism ID, Bacteria CITROBACTER KOSERI  Final      Susceptibility   Citrobacter koseri - MIC*    CEFAZOLIN <=4 SENSITIVE Sensitive     CEFEPIME <=1 SENSITIVE Sensitive     CEFTAZIDIME <=1 SENSITIVE Sensitive     CEFTRIAXONE <=1 SENSITIVE Sensitive     CIPROFLOXACIN <=0.25 SENSITIVE Sensitive     GENTAMICIN <=1 SENSITIVE Sensitive     IMIPENEM <=0.25 SENSITIVE Sensitive     PIP/TAZO <=4  SENSITIVE Sensitive     TOBRAMYCIN <=1 SENSITIVE Sensitive     TRIMETH/SULFA <=20 SENSITIVE Sensitive     * CITROBACTER KOSERI  Urine culture     Status: None   Collection Time: 12/13/14  8:57 PM  Result Value Ref Range Status   Specimen Description URINE, CLEAN CATCH  Final   Special Requests NONE  Final   Colony Count   Final    >=100,000 COLONIES/ML [facility]formed at [facility]    Culture   Final    Multiple bacterial morphotypes present, none predominant.  Suggest appropriate recollection if clinically indicated. Performed at [facility]    Report Status 12/14/2014 FINAL  Final  MRSA PCR Screening     Status: None   Collection Time: 12/13/14 11:00 PM  Result Value Ref Range Status   MRSA by PCR NEGATIVE NEGATIVE Final    Comment:        The GeneXpert MRSA Assay (FDA approved for NASAL specimens only), is one component of a comprehensive MRSA colonization surveillance program. It is not intended to diagnose MRSA infection nor to guide or monitor treatment for MRSA infections.   Culture, blood (routine x 2)     Status: None (Preliminary result)   Collection Time: 12/18/14 12:00 PM  Result Value Ref Range Status   Specimen Description BLOOD RIGHT ARM  Final   Special Requests BOTTLES DRAWN AEROBIC ONLY  10CC  Final   Culture   Final           BLOOD CULTURE RECEIVED NO GROWTH TO DATE CULTURE WILL BE HELD FOR 5 DAYS BEFORE ISSUING A FINAL NEGATIVE REPORT Performed at [facility]    Report Status PENDING  Incomplete    RADIOLOGY STUDIES/RESULTS: [place] Abdomen Complete  12/13/2014   CLINICAL DATA:  Right upper quadrant pain, nausea  EXAM: ULTRASOUND ABDOMEN COMPLETE  COMPARISON:  None.  FINDINGS: Gallbladder: Gallbladder sludge and a gallstone is noted within gallbladder measures 1.1 cm. Mild thickening of gallbladder wall up to 3.4 mm. Although there is no sonographic Murphy's sign, early cholecystitis cannot be excluded. Clinical correlation is necessary.  Common bile duct: Diameter: 2.8 mm in diameter within normal limits.  Liver: No focal lesion identified. Within normal limits in parenchymal echogenicity.  IVC: No abnormality visualized.  Pancreas: Visualized portion unremarkable.  Spleen: Size and appearance within normal limits. Measures 10.2 cm in length. Accessory splenule measures 1.8 x 1.7 cm.  Right Kidney: Length: 11.9 cm. Echogenicity within normal limits. No mass or hydronephrosis visualized.  Left Kidney:  Length: 11.7 cm. Echogenicity within normal limits. No mass or hydronephrosis visualized.  Abdominal aorta: No aneurysm visualized. Measures up to 1.6 cm in diameter.  Other findings: None.  IMPRESSION: 1. Gallbladder sludge and a gallstone is noted within gallbladder measures 1.1 cm. Mild thickening of gallbladder wall up to 3.4 mm. Although there is no sonographic Murphy's sign, early cholecystitis cannot be excluded. Clinical correlation is necessary. 2. Normal CBD. 3. No hydronephrosis.   Electronically Signed   By: Natasha MeadLiviu  Pop M.D.   On: 12/13/2014 17:44   [place] Ob Transvaginal  12/13/2014   CLINICAL DATA:  Evaluate dates.  On known last menstrual.  EXAM: LIMITED OBSTETRIC ULTRASOUND  FINDINGS: Number of Fetuses: 1  Heart Rate:  150 bpm  Movement: Yes  Presentation: Cephalic  Placental Location: Anterior  Previa: No  Amniotic Fluid (Subjective):  Normal  BPD:  3.3cm 16w  1d  MATERNAL FINDINGS:  Cervix:  Appears closed.  Uterus/Adnexae:  No abnormality visualized.  IMPRESSION: 1. Single living intrauterine gestation. The estimated gestational age is 16 weeks and 1 day.  This exam is performed on an emergent basis and does not comprehensively evaluate fetal size, dating, or anatomy; follow-up complete OB US should be considered if further fetal assessment is warranted.   Electronically Signed   By: Signa Kell M.D.   On: 12/13/2014 19:02   US Pelvis Complete  12/16/2014   CLINICAL DATA:  Recent spontaneous abortion. Evaluate for retained products.  EXAM: TRANSABDOMINAL ULTRASOUND OF PELVIS  TECHNIQUE: Transabdominal ultrasound examination of the pelvis was performed including evaluation of the uterus, ovaries, adnexal regions, and pelvic cul-de-sac.  COMPARISON:  12/13/2014  FINDINGS: Uterus  Measurements: 12.0 x 7.1 x 7.3 cm. No fibroids or other mass visualized.  Endometrium  Thickness: 0.8 cm. Endometrial stripe is mildly heterogeneous. No significant fluid within the endometrial cavity.  Right ovary   Measurements: Not visualized.  Left ovary  Measurements: 3.1 x 1.7 x 1.6 cm. Normal appearance with a small follicle.  Other findings:  Small amount of fluid in the cul-de-sac.  IMPRESSION: No evidence for an intrauterine pregnancy. Findings consistent with recent spontaneous abortion.  Endometrial stripe is mildly heterogeneous without gross abnormality. No clear evidence for retained products.  Small amount of free fluid.  Right ovary is not visualized.   Electronically Signed   By: Richarda Overlie M.D.   On: 12/16/2014 08:27   Dg Chest Port 1 View  12/18/2014   CLINICAL DATA:  Pulmonary edema.  EXAM: PORTABLE CHEST - 1 VIEW  COMPARISON:  Radiograph 12/16/2014  FINDINGS: Interval removal of right central venous line. Normal cardiac silhouette. There is improvement bilateral pleural effusions. Bibasilar airspace disease is small effusions to remain. No pneumothorax.  IMPRESSION: Improvement in pulmonary edema and pleural effusions.   Electronically Signed   By: Genevive Bi M.D.   On: 12/18/2014 09:46   Dg Chest Port 1 View  12/16/2014   CLINICAL DATA:  Difficulty breathing  EXAM: PORTABLE CHEST - 1 VIEW  COMPARISON:  December 15, 2014  FINDINGS: Central catheter tip is in the right atrium. No pneumothorax. There is bilateral mid and lower lung zone airspace consolidation with effusions. Heart is upper normal with pulmonary vascularity indicative of pulmonary venous hypertension. No new opacity.  IMPRESSION: Findings felt to be consistent with congestive heart failure. Superimposed pneumonia in the lower lung zones cannot be excluded. Both entities may exist concurrently. No new opacity. No pneumothorax.   Electronically Signed   By: Bretta Bang III M.D.   On: 12/16/2014 07:17   Dg Chest Port 1 View  12/15/2014   CLINICAL DATA:  Acute pulmonary edema, septic shock.  EXAM: PORTABLE CHEST - 1 VIEW  COMPARISON:  Portable chest x-ray of December 14, 2014  FINDINGS: The lung volumes remain low.  Moderate-sized pleural effusions persist layering posteriorly. The pulmonary interstitial markings remain increased. The pulmonary vascularity remains engorged. The cardiac silhouette is largely obscured. The mediastinum is normal in width. The right internal jugular venous catheter tip projects over the distal portion of the SVC.  IMPRESSION: Persistent bilateral pleural effusions and bilateral pulmonary interstitial edema consistent with CHF. There has been slight interval deterioration since yesterday's study.   Electronically Signed   By: David  [place]   On: 12/15/2014 07:45   Dg Chest Port 1 View  12/14/2014   CLINICAL DATA:  Septic shock. Central line placement. Sixteen weeks pregnant.  EXAM: PORTABLE CHEST - 1 VIEW  COMPARISON:  None.  FINDINGS: Right central venous catheter. Tip is at the level of the cavoatrial junction. No pneumothorax. Normal heart size and pulmonary vascularity. Bilateral perihilar basilar infiltrates. Possible pleural effusions. No pneumothorax.  IMPRESSION: Right central venous catheter appears in satisfactory position. No pneumothorax. Basal perihilar infiltrates bilaterally with possible pleural effusions.   Electronically Signed   By: Burman Nieves M.D.   On: 12/14/2014 00:59    Jeoffrey Massed, MD  [facility] Pager:[phone]  If 7PM-7AM, please contact night-coverage [email or web address] Password [email or web address] 12/19/2014, 12:03 PM   LOS: 6 days

## 2014-12-19 NOTE — Progress Notes (Signed)
Patient ID: Jeanette Copeland, female   DOB: 1996/04/04, 19 y.o.   MRN: [id number]     [facility]      [address] [place], [place] [address]    Phone: [phone] FAX: [phone]     Subjective: We have been asked to re-evaluate the patient for possible cholecystitis.  The patient has been hospitalized with sepsis likely to be secondary to GYN/abortion.  She had 2/2 positive blood cultures for citrobacter anemia and has been treated with antibiotics.  The patient reports intermittent RUQ pain.  Last episode was on Saturday night after she had dinner. Location was to the RUQ with radiation to the back.  She has not had any symptoms since. She has been on a regular diet since yesterday afternoon and denies any symptoms.  She has a white count of 13.7k, which is down.  She is afebrile. ast and alt are 53 and 46 respectively with a normal bilirubin.  HIDA scan showed non filling GB.    Objective:  Vital signs:  Filed Vitals:   12/18/14 1551 12/18/14 2048 12/18/14 2220 12/19/14 0515  BP: 103/69 100/65  96/58  Pulse: 101 75  80  Temp: 98.7 F (37.1 C) 99.3 F (37.4 C) 98.5 F (36.9 C) 98.7 F (37.1 C)  TempSrc: Oral Oral Oral Oral  Resp: _0 Height:      Weight:    118 lb 4.8 oz (53.661 kg)  SpO2: 98% 98%  98%    Last BM Date: 12/17/14  Intake/Output   Yesterday:  02/28 0701 - 02/29 0700 In: -  Out: 2550 [Urine:2550] This shift:  Total I/O In: -  Out: 1000 [Urine:1000]   Physical Exam: General: Pt awake/alert/oriented x4 in no acute distress Abdomen: Soft.  Nondistended.  Nontender.  No evidence of peritonitis.  No incarcerated hernias.    Problem List:   Active Problems:   Septic shock   Encounter for central line placement   Hypokalemia   RUQ pain   Pulmonary edema   Spontaneous abortion complicated by shock   Acute blood loss anemia    Results:   Labs: Results for orders placed or performed during  the hospital encounter of 12/13/14 (from the past 48 hour(s))  BMET in AM     Status: Abnormal   Collection Time: 12/18/14  7:36 AM  Result Value Ref Range   Sodium 140 135 - 145 mmol/L   Potassium 3.4 (L) 3.5 - 5.1 mmol/L   Chloride 105 96 - 112 mmol/L   CO2 28 19 - 32 mmol/L   Glucose, Bld 76 70 - 99 mg/dL   BUN <5 (L) 6 - 23 mg/dL   Creatinine, Ser 0.59 0.50 - 1.10 mg/dL   Calcium 8.3 (L) 8.4 - 10.5 mg/dL   GFR calc non Af Amer >90 >90 mL/min   GFR calc Af Amer >90 >90 mL/min    Comment: (NOTE) The eGFR has been calculated using the CKD EPI equation. This calculation has not been validated in all clinical situations. eGFR's persistently <90 mL/min signify possible Chronic Kidney Disease.    Anion gap 7 5 - 15  Magnesium     Status: None   Collection Time: 12/18/14  7:36 AM  Result Value Ref Range   Magnesium 1.5 1.5 - 2.5 mg/dL  CBC     Status: Abnormal   Collection Time: 12/18/14  7:36 AM  Result Value Ref Range   WBC 12.3 (  H) 4.0 - 10.5 K/uL   RBC 3.11 (L) 3.87 - 5.11 MIL/uL   Hemoglobin 8.9 (L) 12.0 - 15.0 g/dL   HCT 26.3 (L) 36.0 - 46.0 %   MCV 84.6 78.0 - 100.0 fL   MCH 28.6 26.0 - 34.0 pg   MCHC 33.8 30.0 - 36.0 g/dL   RDW 15.9 (H) 11.5 - 15.5 %   Platelets 177 150 - 400 K/uL  Culture, blood (routine x 2)     Status: None (Preliminary result)   Collection Time: 12/18/14 12:00 PM  Result Value Ref Range   Specimen Description BLOOD RIGHT ARM    Special Requests BOTTLES DRAWN AEROBIC ONLY  10CC    Culture             BLOOD CULTURE RECEIVED NO GROWTH TO DATE CULTURE WILL BE HELD FOR 5 DAYS BEFORE ISSUING A FINAL NEGATIVE REPORT Performed at [facility]    Report Status PENDING   CBC     Status: Abnormal   Collection Time: 12/19/14  6:05 AM  Result Value Ref Range   WBC 13.7 (H) 4.0 - 10.5 K/uL    Comment: REPEATED TO VERIFY   RBC 3.16 (L) 3.87 - 5.11 MIL/uL   Hemoglobin 9.1 (L) 12.0 - 15.0 g/dL    Comment: REPEATED TO VERIFY   HCT 26.7 (L) 36.0 -  46.0 %   MCV 84.5 78.0 - 100.0 fL   MCH 28.8 26.0 - 34.0 pg   MCHC 34.1 30.0 - 36.0 g/dL   RDW 15.4 11.5 - 15.5 %   Platelets 241 150 - 400 K/uL    Comment: REPEATED TO VERIFY  Comprehensive metabolic panel     Status: Abnormal   Collection Time: 12/19/14  6:05 AM  Result Value Ref Range   Sodium 137 135 - 145 mmol/L   Potassium 3.8 3.5 - 5.1 mmol/L   Chloride 103 96 - 112 mmol/L   CO2 27 19 - 32 mmol/L   Glucose, Bld 90 70 - 99 mg/dL   BUN <5 (L) 6 - 23 mg/dL   Creatinine, Ser 0.58 0.50 - 1.10 mg/dL   Calcium 8.6 8.4 - 10.5 mg/dL   Total Protein 5.4 (L) 6.0 - 8.3 g/dL   Albumin 2.4 (L) 3.5 - 5.2 g/dL   AST 53 (H) 0 - 37 U/L   ALT 46 (H) 0 - 35 U/L   Alkaline Phosphatase 130 (H) 39 - 117 U/L   Total Bilirubin 0.2 (L) 0.3 - 1.2 mg/dL   GFR calc non Af Amer >90 >90 mL/min   GFR calc Af Amer >90 >90 mL/min    Comment: (NOTE) The eGFR has been calculated using the CKD EPI equation. This calculation has not been validated in all clinical situations. eGFR's persistently <90 mL/min signify possible Chronic Kidney Disease.    Anion gap 7 5 - 15    Imaging / Studies: Nm Hepatobiliary Liver Func  12/19/2014   CLINICAL DATA:  Right upper quadrant pain  EXAM: NUCLEAR MEDICINE HEPATOBILIARY IMAGING  TECHNIQUE: Sequential images of the abdomen were obtained out to 60 minutes following intravenous administration of radiopharmaceutical.  RADIOPHARMACEUTICALS:  [employer] ML-46T Choletec  COMPARISON:  12/13/2014.  FINDINGS: There is adequate uptake of radioactive tracer throughout the liver following injection. Visualization of biliary tree is noted at 10 minutes with visualization of the small bowel at 15 minutes. No filling of the gallbladder is noted to 1 hour. Continued imaging was obtained to a total of 2  hours and morphine was administered at 80 minutes following injection. Continued activity in the small bowel is noted. No filling of the gallbladder is seen.  IMPRESSION: No filling of  the gallbladder is noted to a total of 2 hours despite administration of morphine. These changes are consistent with cystic duct obstruction likely related to the gallstones seen on prior ultrasound.   Electronically Signed   By: Inez Catalina M.D.   On: 12/19/2014 13:43   Dg Chest Port 1 View  12/18/2014   CLINICAL DATA:  Pulmonary edema.  EXAM: PORTABLE CHEST - 1 VIEW  COMPARISON:  Radiograph 12/16/2014  FINDINGS: Interval removal of right central venous line. Normal cardiac silhouette. There is improvement bilateral pleural effusions. Bibasilar airspace disease is small effusions to remain. No pneumothorax.  IMPRESSION: Improvement in pulmonary edema and pleural effusions.   Electronically Signed   By: Suzy Bouchard M.D.   On: 12/18/2014 09:46    Medications / Allergies:  Scheduled Meds: . cefTRIAXone (ROCEPHIN)  IV  2 g Intravenous Q24H  . doxycycline (VIBRAMYCIN) IV  200 mg Intravenous 60 min Pre-Op  . ferrous sulfate  325 mg Oral BID WC  .  morphine injection  2 mg Intravenous Once   Continuous Infusions:  PRN Meds:.acetaminophen, diphenhydrAMINE, lidocaine, ondansetron (ZOFRAN) IV, oxyCODONE-acetaminophen, technetium TC 63M mebrofenin  Antibiotics: Anti-infectives    Start     Dose/Rate Route Frequency Ordered Stop   12/18/14 1400  cefTRIAXone (ROCEPHIN) 2 g in dextrose 5 % 50 mL IVPB - Premix     2 g 100 mL/hr over 30 Minutes Intravenous Every 24 hours 12/18/14 1311     12/18/14 0600  doxycycline (VIBRAMYCIN) 200 mg in dextrose 5 % 250 mL IVPB     200 mg 125 mL/hr over 120 Minutes Intravenous 60 min pre-op 12/17/14 1704     12/14/14 0100  piperacillin-tazobactam (ZOSYN) IVPB 3.375 g  Status:  Discontinued     3.375 g 12.5 mL/hr over 240 Minutes Intravenous Every 8 hours 12/13/14 2029 12/18/14 1311   12/13/14 2030  piperacillin-tazobactam (ZOSYN) IVPB 3.375 g  Status:  Discontinued     3.375 g 100 mL/hr over 30 Minutes Intravenous  Once 12/13/14 2027 12/13/14 2029   12/13/14  1545  piperacillin-tazobactam (ZOSYN) IVPB 2.25 g     2.25 g 100 mL/hr over 30 Minutes Intravenous  Once 12/13/14 1539 12/13/14 2020        Assessment/Plan Cholelithiasis Doubt this is the source of her sepsis.  She is asymptomatic and has a benign abdominal exam.  We do not recommend surgery at this time.  We will see how she does overnight, if she develops symptoms then we will reconsider.  Will follow along.     Erby Pian, [facility] Pager [phone]) For consults and floor pages call [phone])  12/19/2014  2:54 PM

## 2014-12-20 DIAGNOSIS — K8050 Calculus of bile duct without cholangitis or cholecystitis without obstruction: Secondary | ICD-10-CM

## 2014-12-20 DIAGNOSIS — R7881 Bacteremia: Secondary | ICD-10-CM

## 2014-12-20 HISTORY — DX: Calculus of bile duct without cholangitis or cholecystitis without obstruction: K80.50

## 2014-12-20 LAB — COMPREHENSIVE METABOLIC PANEL
ALT: 45 U/L — ABNORMAL HIGH (ref 0–35)
AST: 53 U/L — AB (ref 0–37)
Albumin: 2.6 g/dL — ABNORMAL LOW (ref 3.5–5.2)
Alkaline Phosphatase: 121 U/L — ABNORMAL HIGH (ref 39–117)
Anion gap: 9 (ref 5–15)
BILIRUBIN TOTAL: 0.4 mg/dL (ref 0.3–1.2)
BUN: 7 mg/dL (ref 6–23)
CO2: 27 mmol/L (ref 19–32)
CREATININE: 0.46 mg/dL — AB (ref 0.50–1.10)
Calcium: 8.6 mg/dL (ref 8.4–10.5)
Chloride: 104 mmol/L (ref 96–112)
GFR calc Af Amer: >90 mL/min (ref >90)
GFR calc non Af Amer: >90 mL/min (ref >90)
Glucose, Bld: 97 mg/dL (ref 70–99)
Potassium: 3.5 mmol/L (ref 3.5–5.1)
Sodium: 140 mmol/L (ref 135–145)
Total Protein: 5.8 g/dL — ABNORMAL LOW (ref 6.0–8.3)

## 2014-12-20 LAB — CBC
HEMATOCRIT: 27.9 % — AB (ref 36.0–46.0)
Hemoglobin: 9.2 g/dL — ABNORMAL LOW (ref 12.0–15.0)
MCH: 29 pg (ref 26.0–34.0)
MCHC: 33 g/dL (ref 30.0–36.0)
MCV: 88 fL (ref 78.0–100.0)
Platelets: 332 10*3/uL (ref 150–400)
RBC: 3.17 MIL/uL — ABNORMAL LOW (ref 3.87–5.11)
RDW: 16.1 % — AB (ref 11.5–15.5)
WBC: 10.9 10*3/uL — ABNORMAL HIGH (ref 4.0–10.5)

## 2014-12-20 LAB — CULTURE, BLOOD (ROUTINE X 2): Culture: NO GROWTH

## 2014-12-20 MED ORDER — OXYCODONE HCL 5 MG PO TABS: 5.0000 mg | ORAL_TABLET | Freq: Four times a day (QID) | ORAL | Status: DC | PRN

## 2014-12-20 MED ORDER — FERROUS SULFATE 325 (65 FE) MG PO TABS: 325.0000 mg | ORAL_TABLET | Freq: Two times a day (BID) | ORAL | Status: AC

## 2014-12-20 MED ORDER — CIPROFLOXACIN 500 MG/5ML (10%) PO SUSR: 500.0000 mg | Freq: Two times a day (BID) | ORAL | Status: DC

## 2014-12-20 NOTE — Progress Notes (Signed)
DC instructions given to patient and family. Questions answered. Rx given to mother. All belongings sent home with patient and family. CCMD notified of discharge. This RN talked with patient privately about psych/emotions, gave patient numbers she can call if she feels she needs to speak with someone. PIV DC, hemostasis achieved. RN with walk patient out to private vehicle driven by family.

## 2014-12-20 NOTE — Discharge Summary (Signed)
PATIENT DETAILS Name: Jeanette Copeland Age: 19 y.o. Sex: female Date of Birth: 1996-10-02 MRN: [id number]. Admitting Physician: Nelda Bucks, MD [id number], Leanora Cover, MD  Admit Date: 12/13/2014 Discharge date: 12/20/2014  Recommendations for Outpatient Follow-up:  1. Please repeat CBC and LFTs at follow-up with PCP 2. Please ensure follow-up with GYN in-to discuss contraception 3. Please and she will follow up with [facility]-re cholecystectomy 4. New medications: ciprofloxacin and iron supplementation 5. Please follow blood cultures on 2/28 till final-negative at the time of discharge  PRIMARY DISCHARGE DIAGNOSIS:  Active Problems:   Septic shock   Encounter for central line placement   Hypokalemia   RUQ pain   Pulmonary edema   Spontaneous abortion complicated by shock   Acute blood loss anemia      PAST MEDICAL HISTORY: Past Medical History  Diagnosis Date  . Medical history non-contributory     DISCHARGE MEDICATIONS: Current Discharge Medication List    START taking these medications   Details  ciprofloxacin (CIPRO) 500 MG/5ML (10%) suspension Take 5 mLs (500 mg total) by mouth 2 (two) times daily. Take for 4 more days from 12/20/14 Qty: 100 mL, Refills: 0    ferrous sulfate 325 (65 FE) MG tablet Take 1 tablet (325 mg total) by mouth 2 (two) times daily with a meal. Qty: 60 tablet, Refills: 0    oxyCODONE (ROXICODONE) 5 MG immediate release tablet Take 1 tablet (5 mg total) by mouth every 6 (six) hours as needed for severe pain. Qty: 10 tablet, Refills: 0        ALLERGIES:  No Known Allergies  BRIEF HPI:  See H&P, Labs, Consult and Test reports for all details in brief, patient is a 19 year old female who presented to the ED complaining of RUQ pain associated with vomiting and chills. She was found to be hypotensive in the emergency department. She was also found to be pregnant. She was subsequently admitted to the intensive care  unit.  CONSULTATIONS:   pulmonary/intensive care, general surgery and gynecology  PERTINENT RADIOLOGIC STUDIES: Nm Hepatobiliary Liver Func  12/19/2014   CLINICAL DATA:  Right upper quadrant pain  EXAM: NUCLEAR MEDICINE HEPATOBILIARY IMAGING  TECHNIQUE: Sequential images of the abdomen were obtained out to 60 minutes following intravenous administration of radiopharmaceutical.  RADIOPHARMACEUTICALS:  Five [employer] Tc-38m Choletec  COMPARISON:  12/13/2014.  FINDINGS: There is adequate uptake of radioactive tracer throughout the liver following injection. Visualization of biliary tree is noted at 10 minutes with visualization of the small bowel at 15 minutes. No filling of the gallbladder is noted to 1 hour. Continued imaging was obtained to a total of 2 hours and morphine was administered at 80 minutes following injection. Continued activity in the small bowel is noted. No filling of the gallbladder is seen.  IMPRESSION: No filling of the gallbladder is noted to a total of 2 hours despite administration of morphine. These changes are consistent with cystic duct obstruction likely related to the gallstones seen on prior ultrasound.   Electronically Signed   By: Alcide Clever M.D.   On: 12/19/2014 13:43   US Abdomen Complete  12/13/2014   CLINICAL DATA:  Right upper quadrant pain, nausea  EXAM: ULTRASOUND ABDOMEN COMPLETE  COMPARISON:  None.  FINDINGS: Gallbladder: Gallbladder sludge and a gallstone is noted within gallbladder measures 1.1 cm. Mild thickening of gallbladder wall up to 3.4 mm. Although there is no sonographic Murphy's sign, early cholecystitis cannot be excluded. Clinical correlation is necessary.  Common  bile duct: Diameter: 2.8 mm in diameter within normal limits.  Liver: No focal lesion identified. Within normal limits in parenchymal echogenicity.  IVC: No abnormality visualized.  Pancreas: Visualized portion unremarkable.  Spleen: Size and appearance within normal limits. Measures 10.2 cm  in length. Accessory splenule measures 1.8 x 1.7 cm.  Right Kidney: Length: 11.9 cm. Echogenicity within normal limits. No mass or hydronephrosis visualized.  Left Kidney: Length: 11.7 cm. Echogenicity within normal limits. No mass or hydronephrosis visualized.  Abdominal aorta: No aneurysm visualized. Measures up to 1.6 cm in diameter.  Other findings: None.  IMPRESSION: 1. Gallbladder sludge and a gallstone is noted within gallbladder measures 1.1 cm. Mild thickening of gallbladder wall up to 3.4 mm. Although there is no sonographic Murphy's sign, early cholecystitis cannot be excluded. Clinical correlation is necessary. 2. Normal CBD. 3. No hydronephrosis.   Electronically Signed   By: Natasha Mead M.D.   On: 12/13/2014 17:44   US Ob Transvaginal  12/13/2014   CLINICAL DATA:  Evaluate dates.  On known last menstrual.  EXAM: LIMITED OBSTETRIC ULTRASOUND  FINDINGS: Number of Fetuses: 1  Heart Rate:  150 bpm  Movement: Yes  Presentation: Cephalic  Placental Location: Anterior  Previa: No  Amniotic Fluid (Subjective):  Normal  BPD:  3.3cm 16w  1d  MATERNAL FINDINGS:  Cervix:  Appears closed.  Uterus/Adnexae:  No abnormality visualized.  IMPRESSION: 1. Single living intrauterine gestation. The estimated gestational age is 16 weeks and 1 day.  This exam is performed on an emergent basis and does not comprehensively evaluate fetal size, dating, or anatomy; follow-up complete OB US should be considered if further fetal assessment is warranted.   Electronically Signed   By: Signa Kell M.D.   On: 12/13/2014 19:02   US Pelvis Complete  12/16/2014   CLINICAL DATA:  Recent spontaneous abortion. Evaluate for retained products.  EXAM: TRANSABDOMINAL ULTRASOUND OF PELVIS  TECHNIQUE: Transabdominal ultrasound examination of the pelvis was performed including evaluation of the uterus, ovaries, adnexal regions, and pelvic cul-de-sac.  COMPARISON:  12/13/2014  FINDINGS: Uterus  Measurements: 12.0 x 7.1 x 7.3 cm. No fibroids  or other mass visualized.  Endometrium  Thickness: 0.8 cm. Endometrial stripe is mildly heterogeneous. No significant fluid within the endometrial cavity.  Right ovary  Measurements: Not visualized.  Left ovary  Measurements: 3.1 x 1.7 x 1.6 cm. Normal appearance with a small follicle.  Other findings:  Small amount of fluid in the cul-de-sac.  IMPRESSION: No evidence for an intrauterine pregnancy. Findings consistent with recent spontaneous abortion.  Endometrial stripe is mildly heterogeneous without gross abnormality. No clear evidence for retained products.  Small amount of free fluid.  Right ovary is not visualized.   Electronically Signed   By: Richarda Overlie M.D.   On: 12/16/2014 08:27   Dg Chest Port 1 View  12/18/2014   CLINICAL DATA:  Pulmonary edema.  EXAM: PORTABLE CHEST - 1 VIEW  COMPARISON:  Radiograph 12/16/2014  FINDINGS: Interval removal of right central venous line. Normal cardiac silhouette. There is improvement bilateral pleural effusions. Bibasilar airspace disease is small effusions to remain. No pneumothorax.  IMPRESSION: Improvement in pulmonary edema and pleural effusions.   Electronically Signed   By: Genevive Bi M.D.   On: 12/18/2014 09:46   Dg Chest Port 1 View  12/16/2014   CLINICAL DATA:  Difficulty breathing  EXAM: PORTABLE CHEST - 1 VIEW  COMPARISON:  December 15, 2014  FINDINGS: Central catheter tip is in the right  atrium. No pneumothorax. There is bilateral mid and lower lung zone airspace consolidation with effusions. Heart is upper normal with pulmonary vascularity indicative of pulmonary venous hypertension. No new opacity.  IMPRESSION: Findings felt to be consistent with congestive heart failure. Superimposed pneumonia in the lower lung zones cannot be excluded. Both entities may exist concurrently. No new opacity. No pneumothorax.   Electronically Signed   By: Bretta Bang III M.D.   On: 12/16/2014 07:17   Dg Chest Port 1 View  12/15/2014   CLINICAL DATA:  Acute  pulmonary edema, septic shock.  EXAM: PORTABLE CHEST - 1 VIEW  COMPARISON:  Portable chest x-ray of December 14, 2014  FINDINGS: The lung volumes remain low. Moderate-sized pleural effusions persist layering posteriorly. The pulmonary interstitial markings remain increased. The pulmonary vascularity remains engorged. The cardiac silhouette is largely obscured. The mediastinum is normal in width. The right internal jugular venous catheter tip projects over the distal portion of the SVC.  IMPRESSION: Persistent bilateral pleural effusions and bilateral pulmonary interstitial edema consistent with CHF. There has been slight interval deterioration since yesterday's study.   Electronically Signed   By: David  [place]   On: 12/15/2014 07:45   Dg Chest Port 1 View  12/14/2014   CLINICAL DATA:  Septic shock. Central line placement. Sixteen weeks pregnant.  EXAM: PORTABLE CHEST - 1 VIEW  COMPARISON:  None.  FINDINGS: Right central venous catheter. Tip is at the level of the cavoatrial junction. No pneumothorax. Normal heart size and pulmonary vascularity. Bilateral perihilar basilar infiltrates. Possible pleural effusions. No pneumothorax.  IMPRESSION: Right central venous catheter appears in satisfactory position. No pneumothorax. Basal perihilar infiltrates bilaterally with possible pleural effusions.   Electronically Signed   By: Burman Nieves M.D.   On: 12/14/2014 00:59     PERTINENT LAB RESULTS: CBC:  Recent Labs  12/19/14 0605 12/20/14 0604  WBC 13.7* 10.9*  HGB 9.1* 9.2*  HCT 26.7* 27.9*  PLT 241 332   CMET CMP     Component Value Date/Time   NA 140 12/20/2014 0604   K 3.5 12/20/2014 0604   CL 104 12/20/2014 0604   CO2 27 12/20/2014 0604   GLUCOSE 97 12/20/2014 0604   BUN 7 12/20/2014 0604   CREATININE 0.46* 12/20/2014 0604   CALCIUM 8.6 12/20/2014 0604   PROT 5.8* 12/20/2014 0604   ALBUMIN 2.6* 12/20/2014 0604   AST 53* 12/20/2014 0604   ALT 45* 12/20/2014 0604   ALKPHOS 121*  12/20/2014 0604   BILITOT 0.4 12/20/2014 0604   GFRNONAA >90 12/20/2014 0604   GFRAA >90 12/20/2014 0604    GFR Estimated Creatinine Clearance: 95.1 mL/min (by C-G formula based on Cr of 0.46). No results for input(s): LIPASE, AMYLASE in the last 72 hours. No results for input(s): CKTOTAL, CKMB, CKMBINDEX, TROPONINI in the last 72 hours. Invalid input(s): POCBNP No results for input(s): DDIMER in the last 72 hours. No results for input(s): HGBA1C in the last 72 hours. No results for input(s): CHOL, HDL, LDLCALC, TRIG, CHOLHDL, LDLDIRECT in the last 72 hours. No results for input(s): TSH, T4TOTAL, T3FREE, THYROIDAB in the last 72 hours.  Invalid input(s): FREET3 No results for input(s): VITAMINB12, FOLATE, FERRITIN, TIBC, IRON, RETICCTPCT in the last 72 hours. Coags: No results for input(s): INR in the last 72 hours.  Invalid input(s): PT Microbiology: Recent Results (from the past 240 hour(s))  Wet prep, genital     Status: Abnormal   Collection Time: 12/13/14  4:51 PM  Result Value  Ref Range Status   Yeast Wet Prep HPF POC FEW (A) NONE SEEN Final   Trich, Wet Prep NONE SEEN NONE SEEN Final   Clue Cells Wet Prep HPF POC NONE SEEN NONE SEEN Final   WBC, Wet Prep HPF POC FEW (A) NONE SEEN Final  Culture, blood (routine x 2)     Status: None   Collection Time: 12/13/14  8:10 PM  Result Value Ref Range Status   Specimen Description BLOOD LEFT ARM  Final   Special Requests BOTTLES DRAWN AEROBIC AND ANAEROBIC 5CC  Final   Culture   Final    NO GROWTH 5 DAYS Performed at [facility]    Report Status 12/20/2014 FINAL  Final  Culture, blood (routine x 2)     Status: None   Collection Time: 12/13/14  8:17 PM  Result Value Ref Range Status   Specimen Description BLOOD LEFT HAND  Final   Special Requests BOTTLES DRAWN AEROBIC AND ANAEROBIC 2CC  Final   Culture   Final    CITROBACTER KOSERI Note: Gram Stain Report Called to,Read Back By and Verified With: JENNIFER BISHOP  RN 12/16/14 AT 1125 AM BY [facility] Performed at [facility]    Report Status 12/18/2014 FINAL  Final   Organism ID, Bacteria CITROBACTER KOSERI  Final      Susceptibility   Citrobacter koseri - MIC*    CEFAZOLIN <=4 SENSITIVE Sensitive     CEFEPIME <=1 SENSITIVE Sensitive     CEFTAZIDIME <=1 SENSITIVE Sensitive     CEFTRIAXONE <=1 SENSITIVE Sensitive     CIPROFLOXACIN <=0.25 SENSITIVE Sensitive     GENTAMICIN <=1 SENSITIVE Sensitive     IMIPENEM <=0.25 SENSITIVE Sensitive     PIP/TAZO <=4 SENSITIVE Sensitive     TOBRAMYCIN <=1 SENSITIVE Sensitive     TRIMETH/SULFA <=20 SENSITIVE Sensitive     * CITROBACTER KOSERI  Urine culture     Status: None   Collection Time: 12/13/14  8:57 PM  Result Value Ref Range Status   Specimen Description URINE, CLEAN CATCH  Final   Special Requests NONE  Final   Colony Count   Final    >=100,000 COLONIES/ML Performed at [facility]    Culture   Final    Multiple bacterial morphotypes present, none predominant. Suggest appropriate recollection if clinically indicated. Performed at [facility]    Report Status 12/14/2014 FINAL  Final  MRSA PCR Screening     Status: None   Collection Time: 12/13/14 11:00 PM  Result Value Ref Range Status   MRSA by PCR NEGATIVE NEGATIVE Final    Comment:        The GeneXpert MRSA Assay (FDA approved for NASAL specimens only), is one component of a comprehensive MRSA colonization surveillance program. It is not intended to diagnose MRSA infection nor to guide or monitor treatment for MRSA infections.   Culture, blood (routine x 2)     Status: None (Preliminary result)   Collection Time: 12/18/14 12:00 PM  Result Value Ref Range Status   Specimen Description BLOOD RIGHT ARM  Final   Special Requests BOTTLES DRAWN AEROBIC ONLY  10CC  Final   Culture   Final           BLOOD CULTURE RECEIVED NO GROWTH TO DATE CULTURE WILL BE HELD FOR 5 DAYS BEFORE ISSUING A FINAL NEGATIVE  REPORT Performed at [facility]    Report Status PENDING  Incomplete     BRIEF HOSPITAL  COURSE:  Septic shock: Secondary to Citrobacter Koseri bacteremia. Septic shock has resolved. She required admission to the intensive care unit, required IV fluid resuscitation and transient pressor support. Was on Zosyn, narrowed to Rocephin on 2/28. She will be transitioned to ciprofloxacin for a few more days to complete 10-12 days of treatment.   Citrobacter bacteremia: Blood cultures on 2/23 positive for Citrobacter. She presented with right upper quadrant abdominal pain, abdominal ultrasound was positive for cholelithiasis/sludge. Subsequently on 2/25, patient spontaneously aborted (unaware she was pregnant on admission). Question of whether this bacteremia is from GI versus GYN source remains. Repeat pelvic ultrasound on 2/25 shows no intrauterine pregnancy, there was no evidence of retained products or any endometritis. She did have intermittent RUQ pain on 2/27-? Biliary colic-on exam-no RUQ tenderness or Murphy's sign present. No pain since 2/2/7.underwent HIDA scan, was seen in consult by general surgery, since no longer having abdominal pain and tolerating diet, felt not to need inpatient cholecystectomy. General surgery will arrange for outpatient laparoscopic cholecystectomy. Patient and mother both agreeable and willing to pursue this plan. Repeat Blood culture on 2/28 neg so far.    Spontaneous abortion: Patient initially was apparently unaware she was pregnant on admission. However urine pregnancy test was positive, a OB transvaginal ultrasound was positive for intrauterine pregnancy with gestational age of around 41 weeks and 1 day. Unfortunately, patient had a spontaneous abortion on 2/25. OB was consulted while patient was in the intensive care unit. A repeat pelvic ultrasound done on 2/25 did not show any retained products. See subsequently seen by OB on 2/27, no further  recommendations apart from iron supplementation and GYN follow-up on discharge.   Acute blood loss anemia: Likely secondary to bleeding from spontaneous abortion. Transfused 2 units of PRBC, CBC currently stable. Continue to follow at PCPs office   Cholelithiasis: Admitted with RUQ pain, abdominal ultrasound showed cholelithiasis with sludge. LFTs elevated, but now down trending not sure if this is from shock liver or from cholecystitis.  Please ensure LFTs are followed up as outpatient. HIDA scan was positive, but patient will follow-up with general surgery for outpatient cholecystectomy. Please see above.   Thrombocytopenia: From septic shock, resolved.   Leukocytosis: Significantly better, only minimally elevated at the time of discharge.  TODAY-DAY OF DISCHARGE:  Subjective:   Jeanette Copeland today has no headache,no chest abdominal pain,no new weakness tingling or numbness, feels much better wants to go home today.   Objective:   Blood pressure 94/58, pulse 78, temperature 98 F (36.7 C), temperature source Oral, resp. rate 16, height 5\' 5"  (1.651 m), weight 53.661 kg (118 lb 4.8 oz), SpO2 98 %, unknown if currently breastfeeding.  Intake/Output Summary (Last 24 hours) at 12/20/14 1016 Last data filed at 12/20/14 1009  Gross per 24 hour  Intake    580 ml  Output   2000 ml  Net  -1420 ml   Filed Weights   12/18/14 0555 12/19/14 0515 12/20/14 0555  Weight: 51.347 kg (113 lb 3.2 oz) 53.661 kg (118 lb 4.8 oz) 53.661 kg (118 lb 4.8 oz)    Exam Awake Alert, Oriented *3, No new F.N deficits, Normal affect [place].AT,PERRAL Supple Neck,No JVD, No cervical lymphadenopathy appriciated.  Symmetrical Chest wall movement, Good air movement bilaterally, CTAB RRR,No Gallops,Rubs or new Murmurs, No Parasternal Heave +ve B.Sounds, Abd Soft, Non tender, No organomegaly appriciated, No rebound -guarding or rigidity. No Cyanosis, Clubbing or edema, No new Rash or bruise  DISCHARGE  CONDITION: Stable  DISPOSITION:  Home  DISCHARGE INSTRUCTIONS:    Activity:  As tolerated   Diet recommendation: Regular Diet-but low-fat   Discharge Instructions    Call MD for:  persistant nausea and vomiting    Complete by:  As directed      Call MD for:  severe uncontrolled pain    Complete by:  As directed      Call MD for:  temperature >100.4    Complete by:  As directed      Diet general    Complete by:  As directed   Low fat diet     Increase activity slowly    Complete by:  As directed            Follow-up Information    Follow up with [facility].   Why:  two weeks after discharge   Contact information:   [address] [address] [phone]      Follow up with Emelia Loron, MD.   Specialty:  General Surgery   Why:  our office will call you for a follow up and surgery schedule   Contact information:   [address] [place] [address] [phone]       Follow up with Verlon Au, MD On 12/27/2014.   Specialty:  Family Medicine   Why:  Appointment with Dr. Leavy Cella is on 12/27/14 at 10:30 please bring picture ID and insurance card   Contact information:   [address] [facility] [place] [address] [phone]       Total Time spent on discharge equals 45 minutes.  SignedJeoffrey Massed 12/20/2014 10:16 AM

## 2014-12-20 NOTE — Progress Notes (Signed)
Patient ID: Jeanette Copeland, female   DOB: 1996-04-26, 19 y.o.   MRN: [id number]     [place]      [address] [place], [place] [address]    Phone: [phone] FAX: [phone]     Subjective: No pain. Had subway last night, no symptoms.   Objective:  Vital signs:  Filed Vitals:   12/19/14 0515 12/19/14 1446 12/19/14 2139 12/20/14 0555  BP: 96/58 97/61 102/67 94/58  Pulse: 80 85 73 78  Temp: 98.7 F (37.1 C) 97.3 F (36.3 C) 98.3 F (36.8 C) 98 F (36.7 C)  TempSrc: Oral Oral Oral Oral  Resp: _0 Height:      Weight: 118 lb 4.8 oz (53.661 kg)   118 lb 4.8 oz (53.661 kg)  SpO2: 98% 100% 99% 98%    Last BM Date: 12/17/14 (pt states she doesn't have BM every day. Not feeling constip)  Intake/Output   Yesterday:  02/29 0701 - 03/01 0700 In: 340 [P.O.:240; IV Piggyback:100] Out: 2000 [Urine:2000] This shift:    I/O last 3 completed shifts: In: 340 [P.O.:240; IV Piggyback:100] Out: 3300 [Urine:3300]    Physical Exam: General: Pt awake/alert/oriented x4 in no acute distress Abdomen: Soft. Nondistended. Nontender. No evidence of peritonitis. No incarcerated hernias.   Problem List:   Active Problems:   Septic shock   Encounter for central line placement   Hypokalemia   RUQ pain   Pulmonary edema   Spontaneous abortion complicated by shock   Acute blood loss anemia    Results:   Labs: Results for orders placed or performed during the hospital encounter of 12/13/14 (from the past 48 hour(s))  Culture, blood (routine x 2)     Status: None (Preliminary result)   Collection Time: 12/18/14 12:00 PM  Result Value Ref Range   Specimen Description BLOOD RIGHT ARM    Special Requests BOTTLES DRAWN AEROBIC ONLY  10CC    Culture             BLOOD CULTURE RECEIVED NO GROWTH TO DATE CULTURE WILL BE HELD FOR 5 DAYS BEFORE ISSUING A FINAL NEGATIVE REPORT Performed at [facility]    Report Status  PENDING   CBC     Status: Abnormal   Collection Time: 12/19/14  6:05 AM  Result Value Ref Range   WBC 13.7 (H) 4.0 - 10.5 K/uL    Comment: REPEATED TO VERIFY   RBC 3.16 (L) 3.87 - 5.11 MIL/uL   Hemoglobin 9.1 (L) 12.0 - 15.0 g/dL    Comment: REPEATED TO VERIFY   HCT 26.7 (L) 36.0 - 46.0 %   MCV 84.5 78.0 - 100.0 fL   MCH 28.8 26.0 - 34.0 pg   MCHC 34.1 30.0 - 36.0 g/dL   RDW 15.4 11.5 - 15.5 %   Platelets 241 150 - 400 K/uL    Comment: REPEATED TO VERIFY  Comprehensive metabolic panel     Status: Abnormal   Collection Time: 12/19/14  6:05 AM  Result Value Ref Range   Sodium 137 135 - 145 mmol/L   Potassium 3.8 3.5 - 5.1 mmol/L   Chloride 103 96 - 112 mmol/L   CO2 27 19 - 32 mmol/L   Glucose, Bld 90 70 - 99 mg/dL   BUN <5 (L) 6 - 23 mg/dL   Creatinine, Ser 0.58 0.50 - 1.10 mg/dL   Calcium 8.6 8.4 - 10.5 mg/dL   Total Protein 5.4 (  L) 6.0 - 8.3 g/dL   Albumin 2.4 (L) 3.5 - 5.2 g/dL   AST 53 (H) 0 - 37 U/L   ALT 46 (H) 0 - 35 U/L   Alkaline Phosphatase 130 (H) 39 - 117 U/L   Total Bilirubin 0.2 (L) 0.3 - 1.2 mg/dL   GFR calc non Af Amer >90 >90 mL/min   GFR calc Af Amer >90 >90 mL/min    Comment: (NOTE) The eGFR has been calculated using the CKD EPI equation. This calculation has not been validated in all clinical situations. eGFR's persistently <90 mL/min signify possible Chronic Kidney Disease.    Anion gap 7 5 - 15  CBC     Status: Abnormal   Collection Time: 12/20/14  6:04 AM  Result Value Ref Range   WBC 10.9 (H) 4.0 - 10.5 K/uL   RBC 3.17 (L) 3.87 - 5.11 MIL/uL   Hemoglobin 9.2 (L) 12.0 - 15.0 g/dL   HCT 27.9 (L) 36.0 - 46.0 %   MCV 88.0 78.0 - 100.0 fL   MCH 29.0 26.0 - 34.0 pg   MCHC 33.0 30.0 - 36.0 g/dL   RDW 16.1 (H) 11.5 - 15.5 %   Platelets 332 150 - 400 K/uL  Comprehensive metabolic panel     Status: Abnormal   Collection Time: 12/20/14  6:04 AM  Result Value Ref Range   Sodium 140 135 - 145 mmol/L   Potassium 3.5 3.5 - 5.1 mmol/L   Chloride 104 96  - 112 mmol/L   CO2 27 19 - 32 mmol/L   Glucose, Bld 97 70 - 99 mg/dL   BUN 7 6 - 23 mg/dL   Creatinine, Ser 0.46 (L) 0.50 - 1.10 mg/dL   Calcium 8.6 8.4 - 10.5 mg/dL   Total Protein 5.8 (L) 6.0 - 8.3 g/dL   Albumin 2.6 (L) 3.5 - 5.2 g/dL   AST 53 (H) 0 - 37 U/L   ALT 45 (H) 0 - 35 U/L   Alkaline Phosphatase 121 (H) 39 - 117 U/L   Total Bilirubin 0.4 0.3 - 1.2 mg/dL   GFR calc non Af Amer >90 >90 mL/min   GFR calc Af Amer >90 >90 mL/min    Comment: (NOTE) The eGFR has been calculated using the CKD EPI equation. This calculation has not been validated in all clinical situations. eGFR's persistently <90 mL/min signify possible Chronic Kidney Disease.    Anion gap 9 5 - 15    Imaging / Studies: Nm Hepatobiliary Liver Func  12/19/2014   CLINICAL DATA:  Right upper quadrant pain  EXAM: NUCLEAR MEDICINE HEPATOBILIARY IMAGING  TECHNIQUE: Sequential images of the abdomen were obtained out to 60 minutes following intravenous administration of radiopharmaceutical.  RADIOPHARMACEUTICALS:  [employer] QJ-33L Choletec  COMPARISON:  12/13/2014.  FINDINGS: There is adequate uptake of radioactive tracer throughout the liver following injection. Visualization of biliary tree is noted at 10 minutes with visualization of the small bowel at 15 minutes. No filling of the gallbladder is noted to 1 hour. Continued imaging was obtained to a total of 2 hours and morphine was administered at 80 minutes following injection. Continued activity in the small bowel is noted. No filling of the gallbladder is seen.  IMPRESSION: No filling of the gallbladder is noted to a total of 2 hours despite administration of morphine. These changes are consistent with cystic duct obstruction likely related to the gallstones seen on prior ultrasound.   Electronically Signed   By: Linus Mako.D.  On: 12/19/2014 13:43    Medications / Allergies:  Scheduled Meds: . cefTRIAXone (ROCEPHIN)  IV  2 g Intravenous Q24H  .  doxycycline (VIBRAMYCIN) IV  200 mg Intravenous 60 min Pre-Op  . ferrous sulfate  325 mg Oral BID WC  .  morphine injection  2 mg Intravenous Once   Continuous Infusions:  PRN Meds:.acetaminophen, diphenhydrAMINE, lidocaine, ondansetron (ZOFRAN) IV, oxyCODONE-acetaminophen  Antibiotics: Anti-infectives    Start     Dose/Rate Route Frequency Ordered Stop   12/18/14 1400  cefTRIAXone (ROCEPHIN) 2 g in dextrose 5 % 50 mL IVPB - Premix     2 g 100 mL/hr over 30 Minutes Intravenous Every 24 hours 12/18/14 1311     12/18/14 0600  doxycycline (VIBRAMYCIN) 200 mg in dextrose 5 % 250 mL IVPB     200 mg 125 mL/hr over 120 Minutes Intravenous 60 min pre-op 12/17/14 1704     12/14/14 0100  piperacillin-tazobactam (ZOSYN) IVPB 3.375 g  Status:  Discontinued     3.375 g 12.5 mL/hr over 240 Minutes Intravenous Every 8 hours 12/13/14 2029 12/18/14 1311   12/13/14 2030  piperacillin-tazobactam (ZOSYN) IVPB 3.375 g  Status:  Discontinued     3.375 g 100 mL/hr over 30 Minutes Intravenous  Once 12/13/14 2027 12/13/14 2029   12/13/14 1545  piperacillin-tazobactam (ZOSYN) IVPB 2.25 g     2.25 g 100 mL/hr over 30 Minutes Intravenous  Once 12/13/14 1539 12/13/14 2020       Assessment/Plan Symptomatic cholelithiasis -symptom free, no pain, tolerating a regular diet. -stable for discharge from surgical standpoint -our office will schedule a follow up with Dr. Donne Hazel and subsequent lap chole -discussed low fat diet   Erby Pian, [facility] Pager [phone](7A-4:30P) For consults and floor pages call [phone](7A-4:30P)  12/20/2014 10:06 AM

## 2014-12-24 LAB — CULTURE, BLOOD (ROUTINE X 2): Culture: NO GROWTH

## 2014-12-26 ENCOUNTER — Other Ambulatory Visit (INDEPENDENT_AMBULATORY_CARE_PROVIDER_SITE_OTHER): Payer: Self-pay | Admitting: General Surgery

## 2015-01-04 ENCOUNTER — Encounter: Payer: Self-pay | Admitting: Family Medicine

## 2015-01-04 ENCOUNTER — Ambulatory Visit (INDEPENDENT_AMBULATORY_CARE_PROVIDER_SITE_OTHER): Payer: Medicaid Other | Admitting: Family Medicine

## 2015-01-04 VITALS — BP 103/67 | HR 75 | Temp 98.4°F | Wt 101.7 lb

## 2015-01-04 DIAGNOSIS — O039 Complete or unspecified spontaneous abortion without complication: Secondary | ICD-10-CM

## 2015-01-04 DIAGNOSIS — O0381 Shock following complete or unspecified spontaneous abortion: Secondary | ICD-10-CM

## 2015-01-04 NOTE — Progress Notes (Signed)
Patient ID: Jeanette GheeBreanna Bing, female   DOB: 10/22/1995, 19 y.o.   MRN: [id number] Pt had miscarriage, does not want birth control at this time.

## 2015-01-04 NOTE — Progress Notes (Signed)
   Subjective:    Patient ID: Jeanette Copeland, female    DOB: 02/01/1996, 19 y.o.   MRN: [id number]  HPI Patient seen for follow up of spontaneous abortion after sepsis of likely urologic origin.  She reports no further bleeding, cramping, vaginal discharge.  She is interested in contraception.   Review of Systems  Constitutional: Negative for fever and chills.  Respiratory: Negative for shortness of breath.   Gastrointestinal: Negative for nausea, vomiting, abdominal pain and diarrhea.  Genitourinary: Negative for dysuria, decreased urine volume, vaginal bleeding, vaginal discharge, vaginal pain and pelvic pain.       Objective:   Physical Exam  Constitutional: She is oriented to person, place, and time. She appears well-developed and well-nourished.  Cardiovascular: Normal rate, regular rhythm and normal heart sounds.   Pulmonary/Chest: Breath sounds normal. No respiratory distress. She has no wheezes. She has no rales. She exhibits no tenderness.  Abdominal: Soft. Bowel sounds are normal. She exhibits no distension and no mass. There is no tenderness. There is no rebound and no guarding.  Neurological: She is alert and oriented to person, place, and time.  Skin: Skin is warm and dry.  Psychiatric: She has a normal mood and affect. Her behavior is normal. Judgment and thought content normal.      Assessment & Plan:   Problem List Items Addressed This Visit    Spontaneous abortion complicated by shock - Primary     Doing well.  Discussed options of birth control.  Maternal grandmother has history of breast cancer that was hormonally active. Recommended progesterone based contraception.  Discussed POP, depo provera, nexplanon, mirena.  Would like nexplanon.  Will schedule appt for insertion.

## 2015-01-05 ENCOUNTER — Encounter (HOSPITAL_BASED_OUTPATIENT_CLINIC_OR_DEPARTMENT_OTHER): Payer: Self-pay | Admitting: *Deleted

## 2015-01-05 NOTE — Pre-Procedure Instructions (Signed)
Pt. [facility] they will do CM[facility] and CBC, diff.

## 2015-01-10 NOTE — Pre-Procedure Instructions (Signed)
[facility], diff [facility]. Results received. CMET done 12/27/2014 there - results WNL.

## 2015-01-12 ENCOUNTER — Ambulatory Visit (HOSPITAL_BASED_OUTPATIENT_CLINIC_OR_DEPARTMENT_OTHER): Payer: Medicaid Other | Admitting: Anesthesiology

## 2015-01-12 ENCOUNTER — Ambulatory Visit (HOSPITAL_COMMUNITY): Payer: Medicaid Other

## 2015-01-12 ENCOUNTER — Encounter (HOSPITAL_BASED_OUTPATIENT_CLINIC_OR_DEPARTMENT_OTHER)
Admission: RE | Disposition: A | Discharge status: Home / Self Care | Payer: Self-pay | Source: Ambulatory Visit | Attending: General Surgery

## 2015-01-12 ENCOUNTER — Ambulatory Visit (HOSPITAL_BASED_OUTPATIENT_CLINIC_OR_DEPARTMENT_OTHER)
Admission: RE | Admit: 2015-01-12 | Discharge: 2015-01-12 | Disposition: A | Discharge status: Home / Self Care | Payer: Medicaid Other | Source: Ambulatory Visit | Attending: General Surgery | Admitting: General Surgery

## 2015-01-12 ENCOUNTER — Encounter (HOSPITAL_BASED_OUTPATIENT_CLINIC_OR_DEPARTMENT_OTHER): Payer: Self-pay | Admitting: *Deleted

## 2015-01-12 DIAGNOSIS — K819 Cholecystitis, unspecified: Secondary | ICD-10-CM | POA: Insufficient documentation

## 2015-01-12 HISTORY — DX: Personal history of other complications of pregnancy, childbirth and the puerperium: Z87.59

## 2015-01-12 HISTORY — DX: Calculus of bile duct without cholangitis or cholecystitis without obstruction: K80.50

## 2015-01-12 HISTORY — DX: Personal history of other diseases of the circulatory system: Z86.79

## 2015-01-12 HISTORY — DX: Personal history of other medical treatment: Z92.89

## 2015-01-12 HISTORY — DX: Personal history of other infectious and parasitic diseases: Z86.19

## 2015-01-12 HISTORY — DX: Anemia, unspecified: D64.9

## 2015-01-12 HISTORY — PX: CHOLECYSTECTOMY: SHX55

## 2015-01-12 SURGERY — LAPAROSCOPIC CHOLECYSTECTOMY WITH INTRAOPERATIVE CHOLANGIOGRAM
Anesthesia: General

## 2015-01-12 MED ORDER — HYDROMORPHONE HCL 1 MG/ML IJ SOLN
0.2500 mg | INTRAMUSCULAR | Status: DC | PRN
  Administered 2015-01-12 (×2): 0.5 mg via INTRAVENOUS

## 2015-01-12 MED ORDER — SODIUM CHLORIDE 0.9 % IV SOLN
INTRAVENOUS | Status: DC | PRN
  Administered 2015-01-12: 7 mL

## 2015-01-12 MED ORDER — MIDAZOLAM HCL 5 MG/5ML IJ SOLN
INTRAMUSCULAR | Status: DC | PRN
  Administered 2015-01-12: 2 mg via INTRAVENOUS

## 2015-01-12 MED ORDER — HYDROMORPHONE HCL 1 MG/ML IJ SOLN
INTRAMUSCULAR | Status: AC
  Filled 2015-01-12: qty 1

## 2015-01-12 MED ORDER — LIDOCAINE HCL (CARDIAC) 20 MG/ML IV SOLN
INTRAVENOUS | Status: DC | PRN
Start: 2015-01-12 — End: 2015-01-12
  Administered 2015-01-12: 50 mg via INTRAVENOUS

## 2015-01-12 MED ORDER — MIDAZOLAM HCL 2 MG/2ML IJ SOLN: 1.0000 mg | INTRAMUSCULAR | Status: DC | PRN

## 2015-01-12 MED ORDER — DEXAMETHASONE SODIUM PHOSPHATE 4 MG/ML IJ SOLN
INTRAMUSCULAR | Status: DC | PRN
  Administered 2015-01-12: 10 mg via INTRAVENOUS

## 2015-01-12 MED ORDER — LACTATED RINGERS IV SOLN
INTRAVENOUS | Status: DC
  Administered 2015-01-12 (×2): via INTRAVENOUS

## 2015-01-12 MED ORDER — PROPOFOL 10 MG/ML IV BOLUS
INTRAVENOUS | Status: DC | PRN
  Administered 2015-01-12: 200 mg via INTRAVENOUS

## 2015-01-12 MED ORDER — BUPIVACAINE-EPINEPHRINE 0.25% -1:200000 IJ SOLN
INTRAMUSCULAR | Status: DC | PRN
  Administered 2015-01-12: 13 mL

## 2015-01-12 MED ORDER — MIDAZOLAM HCL 2 MG/2ML IJ SOLN
INTRAMUSCULAR | Status: AC
  Filled 2015-01-12: qty 2

## 2015-01-12 MED ORDER — DEXTROSE 5 % IV SOLN
2.0000 g | INTRAVENOUS | Status: AC
  Administered 2015-01-12: 2 g via INTRAVENOUS

## 2015-01-12 MED ORDER — SODIUM CHLORIDE 0.9 % IR SOLN
Status: DC | PRN
  Administered 2015-01-12: 1000 mL

## 2015-01-12 MED ORDER — SUCCINYLCHOLINE CHLORIDE 20 MG/ML IJ SOLN
INTRAMUSCULAR | Status: DC | PRN
  Administered 2015-01-12: 100 mg via INTRAVENOUS

## 2015-01-12 MED ORDER — FENTANYL CITRATE 0.05 MG/ML IJ SOLN
INTRAMUSCULAR | Status: DC | PRN
  Administered 2015-01-12: 50 ug via INTRAVENOUS
  Administered 2015-01-12: 100 ug via INTRAVENOUS

## 2015-01-12 MED ORDER — FENTANYL CITRATE 0.05 MG/ML IJ SOLN
INTRAMUSCULAR | Status: AC
  Filled 2015-01-12: qty 4

## 2015-01-12 MED ORDER — ONDANSETRON HCL 4 MG/2ML IJ SOLN
INTRAMUSCULAR | Status: DC | PRN
  Administered 2015-01-12: 4 mg via INTRAVENOUS

## 2015-01-12 MED ORDER — MIDAZOLAM HCL 2 MG/ML PO SYRP: 12.0000 mg | ORAL_SOLUTION | Freq: Once | ORAL | Status: DC | PRN

## 2015-01-12 MED ORDER — OXYCODONE-ACETAMINOPHEN 10-325 MG PO TABS: 1.0000 | ORAL_TABLET | Freq: Four times a day (QID) | ORAL | Status: DC | PRN

## 2015-01-12 MED ORDER — BUPIVACAINE-EPINEPHRINE (PF) 0.25% -1:200000 IJ SOLN
INTRAMUSCULAR | Status: AC
  Filled 2015-01-12: qty 30

## 2015-01-12 MED ORDER — FENTANYL CITRATE 0.05 MG/ML IJ SOLN: 50.0000 ug | INTRAMUSCULAR | Status: DC | PRN

## 2015-01-12 MED ORDER — DEXTROSE 5 % IV SOLN
INTRAVENOUS | Status: AC
  Filled 2015-01-12 (×2): qty 1

## 2015-01-12 MED ORDER — PROMETHAZINE HCL 25 MG/ML IJ SOLN: 6.2500 mg | INTRAMUSCULAR | Status: DC | PRN

## 2015-01-12 SURGICAL SUPPLY — 42 items
APPLIER CLIP 5 13 M/L LIGAMAX5 (MISCELLANEOUS) ×3
BLADE CLIPPER SURG (BLADE) IMPLANT
CHLORAPREP W/TINT 26ML (MISCELLANEOUS) ×3 IMPLANT
CLIP APPLIE 5 13 M/L LIGAMAX5 (MISCELLANEOUS) ×1 IMPLANT
CLOSURE WOUND 1/2 X4 (GAUZE/BANDAGES/DRESSINGS) ×1
COVER MAYO STAND STRL (DRAPES) ×3 IMPLANT
DECANTER SPIKE VIAL GLASS SM (MISCELLANEOUS) IMPLANT
DEVICE TROCAR PUNCTURE CLOSURE (ENDOMECHANICALS) IMPLANT
DRAPE C-ARM 42X72 X-RAY (DRAPES) ×3 IMPLANT
DRAPE UTILITY XL STRL (DRAPES) ×3 IMPLANT
ELECT REM PT RETURN 9FT ADLT (ELECTROSURGICAL) ×3
ELECTRODE REM PT RTRN 9FT ADLT (ELECTROSURGICAL) ×1 IMPLANT
FILTER SMOKE EVAC LAPAROSHD (FILTER) IMPLANT
GLOVE BIO SURGEON STRL SZ7 (GLOVE) ×3 IMPLANT
GLOVE BIOGEL M 7.0 STRL (GLOVE) ×3 IMPLANT
GLOVE BIOGEL PI IND STRL 6.5 (GLOVE) ×1 IMPLANT
GLOVE BIOGEL PI IND STRL 7.5 (GLOVE) ×2 IMPLANT
GLOVE BIOGEL PI INDICATOR 6.5 (GLOVE) ×2
GLOVE BIOGEL PI INDICATOR 7.5 (GLOVE) ×4
GLOVE ECLIPSE 6.5 STRL STRAW (GLOVE) ×3 IMPLANT
GOWN STRL REUS W/ TWL LRG LVL3 (GOWN DISPOSABLE) ×3 IMPLANT
GOWN STRL REUS W/TWL LRG LVL3 (GOWN DISPOSABLE) ×6
HEMOSTAT SNOW SURGICEL 2X4 (HEMOSTASIS) IMPLANT
LIQUID BAND (GAUZE/BANDAGES/DRESSINGS) ×3 IMPLANT
NS IRRIG 1000ML POUR BTL (IV SOLUTION) ×3 IMPLANT
PACK BASIN DAY SURGERY FS (CUSTOM PROCEDURE TRAY) ×3 IMPLANT
POUCH RETRIEVAL ECOSAC 10 (ENDOMECHANICALS) ×1 IMPLANT
POUCH RETRIEVAL ECOSAC 10MM (ENDOMECHANICALS) ×2
SCISSORS LAP 5X35 DISP (ENDOMECHANICALS) ×3 IMPLANT
SET CHOLANGIOGRAPH 5 50 .035 (SET/KITS/TRAYS/PACK) ×3 IMPLANT
SET IRRIG TUBING LAPAROSCOPIC (IRRIGATION / IRRIGATOR) ×3 IMPLANT
SLEEVE ENDOPATH XCEL 5M (ENDOMECHANICALS) ×6 IMPLANT
SLEEVE SCD COMPRESS KNEE MED (MISCELLANEOUS) ×3 IMPLANT
SPECIMEN JAR SMALL (MISCELLANEOUS) IMPLANT
STRIP CLOSURE SKIN 1/2X4 (GAUZE/BANDAGES/DRESSINGS) ×2 IMPLANT
SUT MNCRL AB 4-0 PS2 18 (SUTURE) ×3 IMPLANT
SUT VICRYL 0 UR6 27IN ABS (SUTURE) IMPLANT
TOWEL OR 17X24 6PK STRL BLUE (TOWEL DISPOSABLE) ×3 IMPLANT
TRAY LAPAROSCOPIC (CUSTOM PROCEDURE TRAY) ×3 IMPLANT
TROCAR XCEL BLUNT TIP 100MML (ENDOMECHANICALS) ×3 IMPLANT
TROCAR XCEL NON-BLD 5MMX100MML (ENDOMECHANICALS) ×3 IMPLANT
TUBING INSUFFLATION (TUBING) ×3 IMPLANT

## 2015-01-12 NOTE — Anesthesia Preprocedure Evaluation (Signed)
Anesthesia Evaluation  Patient identified by MRN, date of birth, ID band Patient awake    Reviewed: Allergy & Precautions, NPO status , Patient's Chart, lab work & pertinent test results  Airway Mallampati: II  TM Distance: >3 FB Neck ROM: Full    Dental   Pulmonary neg pulmonary ROS,  breath sounds clear to auscultation        Cardiovascular negative cardio ROS  Rhythm:Regular Rate:Normal     Neuro/Psych    GI/Hepatic negative GI ROS, Neg liver ROS,   Endo/Other  negative endocrine ROS  Renal/GU      Musculoskeletal   Abdominal   Peds  Hematology  (+) anemia ,   Anesthesia Other Findings   Reproductive/Obstetrics                             Anesthesia Physical Anesthesia Plan  ASA: II  Anesthesia Plan: General   Post-op Pain Management:    Induction: Intravenous  Airway Management Planned: Oral ETT  Additional Equipment:   Intra-op Plan:   Post-operative Plan: Extubation in OR  Informed Consent: I have reviewed the patients History and Physical, chart, labs and discussed the procedure including the risks, benefits and alternatives for the proposed anesthesia with the patient or authorized representative who has indicated his/her understanding and acceptance.     Plan Discussed with: CRNA and Anesthesiologist  Anesthesia Plan Comments:         Anesthesia Quick Evaluation

## 2015-01-12 NOTE — Anesthesia Procedure Notes (Signed)
Procedure Name: Intubation Date/Time: 01/12/2015 10:38 AM Performed by: Caren MacadamARTER, Shuntae Herzig W Pre-anesthesia Checklist: Patient identified, Emergency Drugs available, Suction available and Patient being monitored Patient Re-evaluated:Patient Re-evaluated prior to inductionOxygen Delivery Method: Circle System Utilized Preoxygenation: Pre-oxygenation with 100% oxygen Intubation Type: IV induction Ventilation: Mask ventilation without difficulty Laryngoscope Size: Miller and 2 Tube type: Oral Tube size: 7.0 mm Number of attempts: 1 Airway Equipment and Method: Stylet and Oral airway Placement Confirmation: ETT inserted through vocal cords under direct vision,  positive ETCO2 and breath sounds checked- equal and bilateral Secured at: 22 cm Tube secured with: Tape Dental Injury: Teeth and Oropharynx as per pre-operative assessment

## 2015-01-12 NOTE — Op Note (Signed)
Preoperative diagnosis:cholecystitis Postoperative diagnosis: same as above Procedure: laparoscopic cholecystectomy with cholangiogram Surgeon: Dr Harden MoMatt Chapman Matteucci Anesthesia: general EBL: minimal Drains none Specimen gb and contents to pathology Complications: none Sponge count correct at completion Disposition to recovery stable  Indications: This is a [age] yof who has symptoms of biliary colic. She was recently admitted to the hospital and was septic. This was not from her gallbladder but she was noted to have abnormal us and hida scan. She has resolved the episode and we discussed proceeding with cholecystectomy and cholangiograrm.   Procedure: After informed consent was obtained the patient was taken to the operating room. She was given antibiotics. Sequential compression devices were on her legs. She was placed under general anesthesia without complication. Her abdomen was prepped and draped in the standard sterile surgical fashion. A surgical timeout was then performed.  I infiltrated marcaine below the umbilicus and then made a vertical incision. I incised the fascia and entered the peritoneum bluntly. I placed a 0 vicryl pursestring suture and inserted a hasson trocar. I then placed three additional five mm trocars in the epigastrium and right side of abdomen. The gallbladder was then retracted cephalad and lateral There were some omental adhesions I took down.  . I then was able to identify the cystic duct and clearly had the critical view of safety. I then clipped the duct distally. i mad a ductotomy and introduced a cook catheter. I then did a cholangiogram that showed filling of the duodenum, catheter in the cystic duct and filling of both sides of the liver. There were no stones evident.  I removed the catheter.  I then clipped the cystic duct and divided it. The duct was viable and the clips traversed the duct. I then treated the artery in similar fashion. I then removed the gallbladder  from the liver bed and placed it in a bag. It was then removed from the umbilicus. I then obtained hemostasis and irrigated. I then removed my hasson trocar and tied this suture down. This obliterated the defect. I then desufflated the abdomen and removed the remaining trocars. . I then close these with 4-0 Monocryl and Dermabond. She tolerated this well was extubated and transferred to the recovery room in stable condition.

## 2015-01-12 NOTE — Discharge Instructions (Signed)
[facility] LAPAROSCOPIC SURGERY: POST OP INSTRUCTIONS  Always review your discharge instruction sheet given to you by the facility where your surgery was performed. IF YOU HAVE DISABILITY OR FAMILY LEAVE FORMS, YOU MUST BRING THEM TO THE OFFICE FOR PROCESSING.   DO NOT GIVE THEM TO YOUR DOCTOR.  1. A prescription for pain medication may be given to you upon discharge.  Take your pain medication as prescribed, if needed.  If narcotic pain medicine is not needed, then you may take acetaminophen (Tylenol), naprosyn (Alleve), or ibuprofen (Advil) as needed. 2. Take your usually prescribed medications unless otherwise directed. 3. If you need a refill on your pain medication, please contact your pharmacy.  They will contact our office to request authorization. Prescriptions will not be filled after 5pm or on week-ends. 4. You should follow a light diet the first few days after arrival home, such as soup and crackers, etc.  Be sure to include lots of fluids daily. 5. Most patients will experience some swelling and bruising in the area of the incisions.  Ice packs will help.  Swelling and bruising can take several days to resolve.  6. It is common to experience some constipation if taking pain medication after surgery.  Increasing fluid intake and taking a stool softener (such as Colace) will usually help or prevent this problem from occurring.  A mild laxative (Milk of Magnesia or Miralax) should be taken according to package instructions if there are no bowel movements after 48 hours. 7. Unless discharge instructions indicate otherwise, you may remove your bandages 48 hours after surgery, and you may shower at that time.  You may have steri-strips (small skin tapes) in place directly over the incision.  These strips should be left on the skin for 7-10 days.  If your surgeon used skin glue on the incision, you may shower in 24 hours.  The glue will flake  off over the next 2-3 weeks.  Any sutures or staples will be removed at the office during your follow-up visit. 8. ACTIVITIES:  You may resume regular (light) daily activities beginning the next day--such as daily self-care, walking, climbing stairs--gradually increasing activities as tolerated.  You may have sexual intercourse when it is comfortable.  Refrain from any heavy lifting or straining until approved by your doctor. a. You may drive when you are no longer taking prescription pain medication, you can comfortably wear a seatbelt, and you can safely maneuver your car and apply brakes. b. RETURN TO WORK:  __________________________________________________________ 9. You should see your doctor in the office for a follow-up appointment approximately 2-3 weeks after your surgery.  Make sure that you call for this appointment within a day or two after you arrive home to insure a convenient appointment time. 10. OTHER INSTRUCTIONS: __________________________________________________________________________________________________________________________ __________________________________________________________________________________________________________________________ WHEN TO CALL YOUR DOCTOR: 1. Fever over 101.0 2. Inability to urinate 3. Continued bleeding from incision. 4. Increased pain, redness, or drainage from the incision. 5. Increasing abdominal pain  The clinic staff is available to answer your questions during regular business hours.  Please dont hesitate to call and ask to speak to one of the nurses for clinical concerns.  If you have a medical emergency, go to the nearest emergency room or call 911.  A surgeon from [facility] is always on call at the hospital. [address], [place], [place], [place]  [address] ? [address], [place], [place]   [address] [phone] ? [phone] ? FAX [phone]  [phone] Web site: [email or web address]    Post  Anesthesia Home Care Instructions  Activity: Get plenty of rest for the remainder of the day. A responsible adult should stay with you for 24 hours following the procedure.  For the next 24 hours, DO NOT: -Drive a car -Advertising copywriterperate machinery -Drink alcoholic beverages -Take any medication unless instructed by your physician -Make any legal decisions or sign important papers.  Meals: Start with liquid foods such as gelatin or soup. Progress to regular foods as tolerated. Avoid greasy, spicy, heavy foods. If nausea and/or vomiting occur, drink only clear liquids until the nausea and/or vomiting subsides. Call your physician if vomiting continues.  Special Instructions/Symptoms: Your throat may feel dry or sore from the anesthesia or the breathing tube placed in your throat during surgery. If this causes discomfort, gargle with warm salt water. The discomfort should disappear within 24 hours.

## 2015-01-12 NOTE — Transfer of Care (Signed)
Immediate Anesthesia Transfer of Care Note  Patient: Jeanette Copeland  Procedure(s) Performed: Procedure(s): LAPAROSCOPIC CHOLECYSTECTOMY WITH INTRAOPERATIVE CHOLANGIOGRAM (N/A)  Patient Location: PACU  Anesthesia Type:General  Level of Consciousness: awake  Airway & Oxygen Therapy: Patient Spontanous Breathing and Patient connected to face mask oxygen  Post-op Assessment: Report given to RN and Post -op Vital signs reviewed and stable  Post vital signs: Reviewed and stable  Last Vitals:  Filed Vitals:   01/12/15 0904  BP: 106/77  Pulse: 76  Temp: 36.7 C  Resp: 16    Complications: No apparent anesthesia complications

## 2015-01-12 NOTE — Anesthesia Postprocedure Evaluation (Signed)
  Anesthesia Post-op Note  Patient: Jeanette Copeland  Procedure(s) Performed: Procedure(s): LAPAROSCOPIC CHOLECYSTECTOMY WITH INTRAOPERATIVE CHOLANGIOGRAM (N/A)  Patient Location: PACU  Anesthesia Type:General  Level of Consciousness: awake  Airway and Oxygen Therapy: Patient Spontanous Breathing  Post-op Pain: mild  Post-op Assessment: Post-op Vital signs reviewed  Post-op Vital Signs: Reviewed  Last Vitals:  Filed Vitals:   01/12/15 1245  BP: 105/59  Pulse: 84  Temp:   Resp: 15    Complications: No apparent anesthesia complications

## 2015-01-12 NOTE — Interval H&P Note (Signed)
History and Physical Interval Note:  01/12/2015 10:24 AM  Jeanette Copeland  has presented today for surgery, with the diagnosis of biliary colic  The various methods of treatment have been discussed with the patient and family. After consideration of risks, benefits and other options for treatment, the patient has consented to  Procedure(s): LAPAROSCOPIC CHOLECYSTECTOMY WITH INTRAOPERATIVE CHOLANGIOGRAM (N/A) as a surgical intervention .  The patient's history has been reviewed, patient examined, no change in status, stable for surgery.  I have reviewed the patient's chart and labs.  Questions were answered to the patient's satisfaction.     [name]

## 2015-01-12 NOTE — H&P (Signed)
19 yof admitted recently with ruq pain as well as spontaneous ab that was making her ill. This has resolved. She did have [place] with gallstones and hida that was nonfilling. She does not have any more pain but has been on a very bland diet since then. Otherwise she is completely recovered.  Other Problems Gilmer Mor, CMA; 01/03/2015 10:40 AM) Cholelithiasis  Past Surgical History Gilmer Mor, CMA; 01/03/2015 10:40 AM) No pertinent past surgical history  Diagnostic Studies History Gilmer Mor, CMA; 01/03/2015 10:40 AM) Colonoscopy never Pap Smear never  Allergies (Sonya Bynum, CMA; 01/03/2015 10:41 AM) No Known Drug Allergies03/15/2016  Medication History (Sonya Bynum, CMA; 01/03/2015 10:42 AM) Ferrous Sulfate (325 (65 Fe)MG Tablet, Oral) Active. Medications Reconciled  Social History Gilmer Mor, CMA; 01/03/2015 10:40 AM) Caffeine use Carbonated beverages, Coffee, Tea. No alcohol use No drug use Tobacco use Never smoker.  Family History Gilmer Mor, CMA; 01/03/2015 10:40 AM) Breast Cancer Family Members In General. Cerebrovascular Accident Family Members In General. Depression Family Members In General, Mother. Diabetes Mellitus Family Members In General. Hypertension Family Members In General. Migraine Headache Family Members In General.  Pregnancy / Birth History Gilmer Mor, CMA; 01/03/2015 10:40 AM) Age at menarche 13 years. Gravida 1 Maternal age 56-20 Para 0 Regular periods  Review of Systems Lamar Laundry Bynum CMA; 01/03/2015 10:40 AM) General Not Present- Appetite Loss, Chills, Fatigue, Fever, Night Sweats, Weight Gain and Weight Loss. Skin Not Present- Change in Wart/Mole, Dryness, Hives, Jaundice, New Lesions, Non-Healing Wounds, Rash and Ulcer. HEENT Not Present- Earache, Hearing Loss, Hoarseness, Nose Bleed, Oral Ulcers, Ringing in the Ears, Seasonal Allergies, Sinus Pain, Sore Throat, Visual Disturbances, Wears glasses/contact lenses and Yellow  Eyes. Respiratory Not Present- Bloody sputum, Chronic Cough, Difficulty Breathing, Snoring and Wheezing. Breast Not Present- Breast Mass, Breast Pain, Nipple Discharge and Skin Changes. Cardiovascular Not Present- Chest Pain, Difficulty Breathing Lying Down, Leg Cramps, Palpitations, Rapid Heart Rate, Shortness of Breath and Swelling of Extremities. Gastrointestinal Not Present- Abdominal Pain, Bloating, Bloody Stool, Change in Bowel Habits, Chronic diarrhea, Constipation, Difficulty Swallowing, Excessive gas, Gets full quickly at meals, Hemorrhoids, Indigestion, Nausea, Rectal Pain and Vomiting. Female Genitourinary Not Present- Frequency, Nocturia, Painful Urination, Pelvic Pain and Urgency. Musculoskeletal Not Present- Back Pain, Joint Pain, Joint Stiffness, Muscle Pain, Muscle Weakness and Swelling of Extremities. Neurological Not Present- Decreased Memory, Fainting, Headaches, Numbness, Seizures, Tingling, Tremor, Trouble walking and Weakness. Psychiatric Not Present- Anxiety, Bipolar, Change in Sleep Pattern, Depression, Fearful and Frequent crying. Endocrine Not Present- Cold Intolerance, Excessive Hunger, Hair Changes, Heat Intolerance, Hot flashes and New Diabetes. Hematology Not Present- Easy Bruising, Excessive bleeding, Gland problems, HIV and Persistent Infections.   Vitals (Sonya Bynum CMA; 01/03/2015 10:41 AM) 01/03/2015 10:41 AM Weight: 101 lb Height: 65in Body Surface Area: 1.45 m Body Mass Index: 16.81 kg/m Temp.: 97.54F(Temporal)  Pulse: 74 (Regular)  BP: 110/68 (Sitting, Left Arm, Standard)    Physical Exam Emelia Loron MD; 01/03/2015 10:55 AM) General Mental Status-Alert. Orientation-Oriented X3.  Chest and Lung Exam Chest and lung exam reveals -on auscultation, normal breath sounds, no adventitious sounds and normal vocal resonance.  Cardiovascular Cardiovascular examination reveals -normal heart sounds, regular rate and rhythm with no  murmurs.  Abdomen Note: soft nt/nd bs present     Assessment & Plan Emelia Loron MD; 01/03/2015 10:54 AM) CHOLELITHIASIS (574.20  K80.20)  Laparoscopic cholecystectomy I discussed the procedure in detail. The patient was given educational material. We discussed the risks and benefits of a laparoscopic cholecystectomy and possible cholangiogram including,  but not limited to bleeding, infection, injury to surrounding structures such as the intestine or liver, bile leak, retained gallstones, need to convert to an open procedure, prolonged diarrhea, blood clots such as DVT, common bile duct injury, anesthesia risks, and possible need for additional procedures. The likelihood of improvement in symptoms and return to the patient's normal status is good. We discussed the typical post-operative recovery course.

## 2015-01-17 ENCOUNTER — Encounter (HOSPITAL_BASED_OUTPATIENT_CLINIC_OR_DEPARTMENT_OTHER): Payer: Self-pay | Admitting: General Surgery

## 2015-01-17 ENCOUNTER — Encounter: Payer: Self-pay | Admitting: *Deleted

## 2015-02-13 ENCOUNTER — Encounter: Payer: Self-pay | Admitting: Family Medicine

## 2015-02-13 ENCOUNTER — Ambulatory Visit (INDEPENDENT_AMBULATORY_CARE_PROVIDER_SITE_OTHER): Payer: Medicaid Other | Admitting: Family Medicine

## 2015-02-13 VITALS — BP 111/76 | HR 89 | Temp 98.0°F | Ht 65.0 in | Wt 101.3 lb

## 2015-02-13 DIAGNOSIS — Z3049 Encounter for surveillance of other contraceptives: Secondary | ICD-10-CM | POA: Diagnosis present

## 2015-02-13 DIAGNOSIS — Z3202 Encounter for pregnancy test, result negative: Secondary | ICD-10-CM | POA: Diagnosis not present

## 2015-02-13 DIAGNOSIS — Z30017 Encounter for initial prescription of implantable subdermal contraceptive: Secondary | ICD-10-CM

## 2015-02-13 LAB — POCT PREGNANCY, URINE: Preg Test, Ur: NEGATIVE

## 2015-02-13 MED ORDER — ETONOGESTREL 68 MG ~~LOC~~ IMPL
68.0000 mg | DRUG_IMPLANT | Freq: Once | SUBCUTANEOUS | Status: AC
  Administered 2015-02-13: 68 mg via SUBCUTANEOUS

## 2015-02-13 NOTE — Addendum Note (Signed)
Addended by: Gerome ApleyZEYFANG, LINDA L on: 02/13/2015 02:23 PM   Modules accepted: Orders

## 2015-02-13 NOTE — Progress Notes (Signed)
NEXPLANON INSERTION  Patient given informed consent, signed copy in the chart, time out was performed. Pregnancy test was negative. Appropriate time out taken.  Patient's left arm was prepped and draped in the usual sterile fashion.. The ruler used to measure and mark insertion area.  Pt was prepped with alcohol swab and then injected with 3 cc of 1% lidocaine with epinephrine.  Pt was prepped with betadine, Implanon removed form packaging,  Device confirmed in needle, then inserted full length of needle and withdrawn per handbook instructions.  Pt insertion site covered with coban.   Minimal blood loss.  Pt tolerated the procedure well.

## 2015-02-13 NOTE — Patient Instructions (Signed)
Etonogestrel implant What is this medicine? ETONOGESTREL (et oh noe JES trel) is a contraceptive (birth control) device. It is used to prevent pregnancy. It can be used for up to 3 years. This medicine may be used for other purposes; ask your health care provider or pharmacist if you have questions. COMMON BRAND NAME(S): Implanon, Nexplanon What should I tell my health care provider before I take this medicine? They need to know if you have any of these conditions: -abnormal vaginal bleeding -blood vessel disease or blood clots -cancer of the breast, cervix, or liver -depression -diabetes -gallbladder disease -headaches -heart disease or recent heart attack -high blood pressure -high cholesterol -kidney disease -liver disease -renal disease -seizures -tobacco smoker -an unusual or allergic reaction to etonogestrel, other hormones, anesthetics or antiseptics, medicines, foods, dyes, or preservatives -pregnant or trying to get pregnant -breast-feeding How should I use this medicine? This device is inserted just under the skin on the inner side of your upper arm by a health care professional. Talk to your pediatrician regarding the use of this medicine in children. Special care may be needed. Overdosage: If you think you've taken too much of this medicine contact a poison control center or emergency room at once. Overdosage: If you think you have taken too much of this medicine contact a poison control center or emergency room at once. NOTE: This medicine is only for you. Do not share this medicine with others. What if I miss a dose? This does not apply. What may interact with this medicine? Do not take this medicine with any of the following medications: -amprenavir -bosentan -fosamprenavir This medicine may also interact with the following medications: -barbiturate medicines for inducing sleep or treating seizures -certain medicines for fungal infections like ketoconazole and  itraconazole -griseofulvin -medicines to treat seizures like carbamazepine, felbamate, oxcarbazepine, phenytoin, topiramate -modafinil -phenylbutazone -rifampin -some medicines to treat HIV infection like atazanavir, indinavir, lopinavir, nelfinavir, tipranavir, ritonavir -St. John's wort This list may not describe all possible interactions. Give your health care provider a list of all the medicines, herbs, non-prescription drugs, or dietary supplements you use. Also tell them if you smoke, drink alcohol, or use illegal drugs. Some items may interact with your medicine. What should I watch for while using this medicine? This product does not protect you against HIV infection (AIDS) or other sexually transmitted diseases. You should be able to feel the implant by pressing your fingertips over the skin where it was inserted. Tell your doctor if you cannot feel the implant. What side effects may I notice from receiving this medicine? Side effects that you should report to your doctor or health care professional as soon as possible: -allergic reactions like skin rash, itching or hives, swelling of the face, lips, or tongue -breast lumps -changes in vision -confusion, trouble speaking or understanding -dark urine -depressed mood -general ill feeling or flu-like symptoms -light-colored stools -loss of appetite, nausea -right upper belly pain -severe headaches -severe pain, swelling, or tenderness in the abdomen -shortness of breath, chest pain, swelling in a leg -signs of pregnancy -sudden numbness or weakness of the face, arm or leg -trouble walking, dizziness, loss of balance or coordination -unusual vaginal bleeding, discharge -unusually weak or tired -yellowing of the eyes or skin Side effects that usually do not require medical attention (Report these to your doctor or health care professional if they continue or are bothersome.): -acne -breast pain -changes in  weight -cough -fever or chills -headache -irregular menstrual bleeding -itching, burning, and   vaginal discharge -pain or difficulty passing urine -sore throat This list may not describe all possible side effects. Call your doctor for medical advice about side effects. You may report side effects to FDA at 1-800-FDA-1088. Where should I keep my medicine? This drug is given in a hospital or clinic and will not be stored at home. NOTE: This sheet is a summary. It may not cover all possible information. If you have questions about this medicine, talk to your doctor, pharmacist, or health care provider.  2015, [employer] Standard. (2012-04-13 15:37:45)  

## 2015-03-16 ENCOUNTER — Ambulatory Visit: Payer: Medicaid Other | Admitting: Family Medicine

## 2015-04-05 ENCOUNTER — Ambulatory Visit (INDEPENDENT_AMBULATORY_CARE_PROVIDER_SITE_OTHER): Payer: Medicaid Other | Admitting: Obstetrics & Gynecology

## 2015-04-05 ENCOUNTER — Other Ambulatory Visit (HOSPITAL_COMMUNITY)
Admission: RE | Admit: 2015-04-05 | Discharge: 2015-04-05 | Disposition: A | Discharge status: Home / Self Care | Payer: Medicaid Other | Source: Ambulatory Visit | Attending: Obstetrics & Gynecology | Admitting: Obstetrics & Gynecology

## 2015-04-05 ENCOUNTER — Encounter: Payer: Self-pay | Admitting: Obstetrics & Gynecology

## 2015-04-05 VITALS — BP 126/84 | HR 88 | Temp 98.2°F | Ht 65.0 in | Wt 101.5 lb

## 2015-04-05 DIAGNOSIS — Z118 Encounter for screening for other infectious and parasitic diseases: Secondary | ICD-10-CM | POA: Diagnosis not present

## 2015-04-05 DIAGNOSIS — Z113 Encounter for screening for infections with a predominantly sexual mode of transmission: Secondary | ICD-10-CM | POA: Diagnosis present

## 2015-04-05 DIAGNOSIS — Z0000 Encounter for general adult medical examination without abnormal findings: Secondary | ICD-10-CM

## 2015-04-05 DIAGNOSIS — Z01419 Encounter for gynecological examination (general) (routine) without abnormal findings: Secondary | ICD-10-CM | POA: Insufficient documentation

## 2015-04-05 DIAGNOSIS — Z124 Encounter for screening for malignant neoplasm of cervix: Secondary | ICD-10-CM | POA: Diagnosis not present

## 2015-04-05 NOTE — Progress Notes (Signed)
Subjective:    Jeanette Copeland is a 19 y.o. SW P0 female who presents for an annual exam. The patient has no complaints today. The patient is not currently sexually active. GYN screening history: no prior history of gyn screening tests. The patient wears seatbelts: yes. The patient participates in regular exercise: no. Has the patient ever been transfused or tattooed?: no. The patient reports that there is not domestic violence in her life.   Menstrual History: OB History    Gravida Para Term Preterm AB TAB SAB Ectopic Multiple Living   1    1  1    0      Menarche age: 43  Patient's last menstrual period was 03/05/2015. Period Cycle (Days): 45 Period Duration (Days): 30days Period Pattern: Regular Menstrual Flow: Light Menstrual Control: Maxi pad Menstrual Control Change Freq (Hours): 12 Dysmenorrhea: None  The following portions of the patient's history were reviewed and updated as appropriate: allergies, current medications, past family history, past medical history, past social history, past surgical history and problem list.  Review of Systems A comprehensive review of systems was negative.    Objective:    BP 126/84 mmHg  Pulse 88  Temp(Src) 98.2 F (36.8 C)  Ht 5\' 5"  (1.651 m)  Wt 101 lb 8 oz (46.04 kg)  BMI 16.89 kg/m2  LMP 03/05/2015  General Appearance:   Flat affect, poor historian, She states that she is not depressed and that her current affect is her usual affect.  Head:    Normocephalic, without obvious abnormality, atraumatic  Eyes:    PERRL, conjunctiva/corneas clear, EOM's intact, fundi    benign, both eyes  Ears:    Normal TM's and external ear canals, both ears  Nose:   Nares normal, septum midline, mucosa normal, no drainage    or sinus tenderness  Throat:   Lips, mucosa, and tongue normal; teeth and gums normal  Neck:   Supple, symmetrical, trachea midline, no adenopathy;    thyroid:  no enlargement/tenderness/nodules; no carotid   bruit or JVD  Back:      Symmetric, no curvature, ROM normal, no CVA tenderness  Lungs:     Clear to auscultation bilaterally, respirations unlabored  Chest Wall:    No tenderness or deformity   Heart:    Regular rate and rhythm, S1 and S2 normal, no murmur, rub   or gallop  Breast Exam:    No tenderness, masses, or nipple abnormality  Abdomen:     Soft, non-tender, bowel sounds active all four quadrants,    no masses, no organomegaly  Genitalia:    Normal female without lesion, discharge or tenderness, NSSA, NT, mobile, normal adnexal exam     Extremities:   Extremities normal, atraumatic, no cyanosis or edema  Pulses:   2+ and symmetric all extremities  Skin:   Skin color, texture, turgor normal, no rashes or lesions  Lymph nodes:   Cervical, supraclavicular, and axillary nodes normal  Neurologic:   CNII-XII intact, normal strength, sensation and reflexes    throughout  .    Assessment:    Healthy female exam.    Plan:     Chlamydia specimen. GC specimen. Thin prep Pap smear.   I have recommended that she get her Gardasil series at the health dept.

## 2015-04-07 LAB — CYTOLOGY - PAP

## 2015-04-19 ENCOUNTER — Encounter: Payer: Self-pay | Admitting: Family Medicine

## 2015-11-06 IMAGING — US US PELVIS COMPLETE
2 series · 14 of 25 positions shown · non-contrast
Comparison: 12/13/2014

CLINICAL DATA: Recent spontaneous abortion. Evaluate for retained
products.

EXAM:
TRANSABDOMINAL ULTRASOUND OF PELVIS
TECHNIQUE: Transabdominal ultrasound examination of the pelvis was performed
including evaluation of the uterus, ovaries, adnexal regions, and
pelvic cul-de-sac.

[Series 1: us pelvis complete · 0.25mm/px · 6 of 23 slices shown (1 of 2)]
[im 1/23]
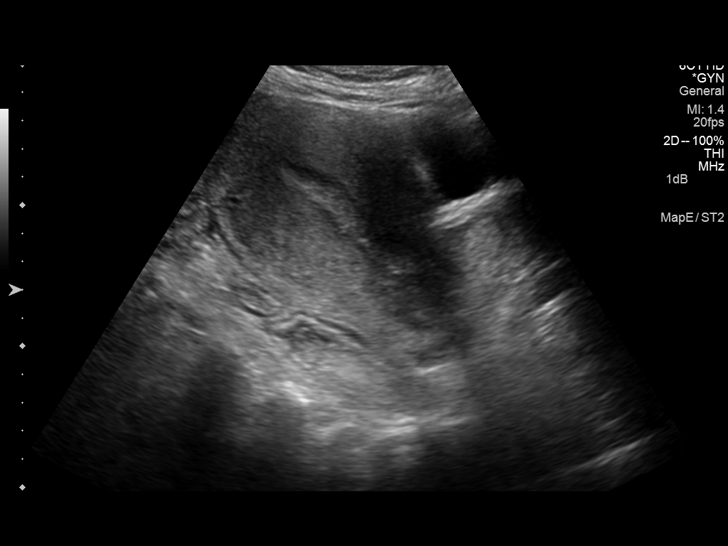
[im 5/23]
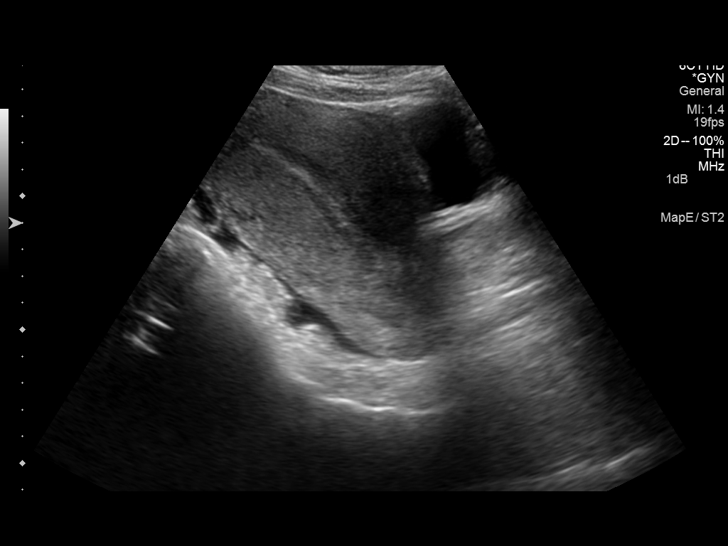
[im 9/23]
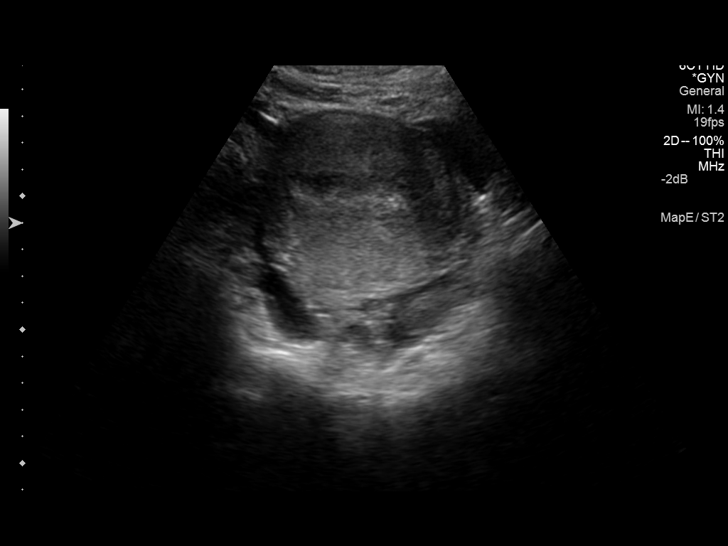
[im 14/23]
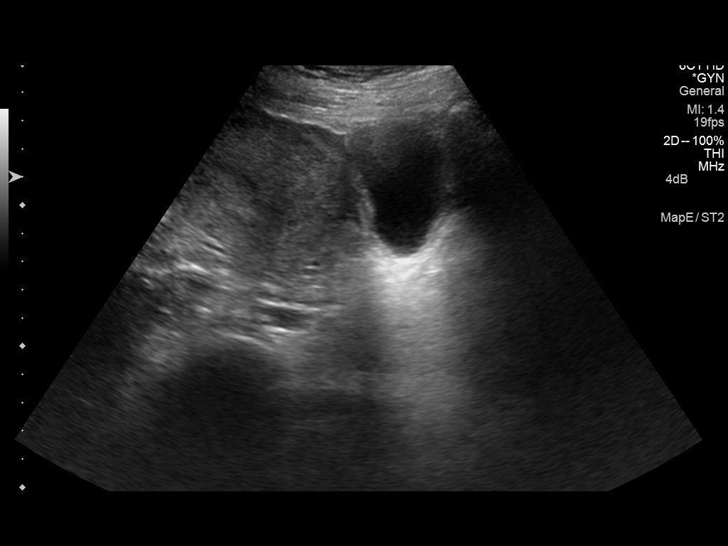
[im 18/23]
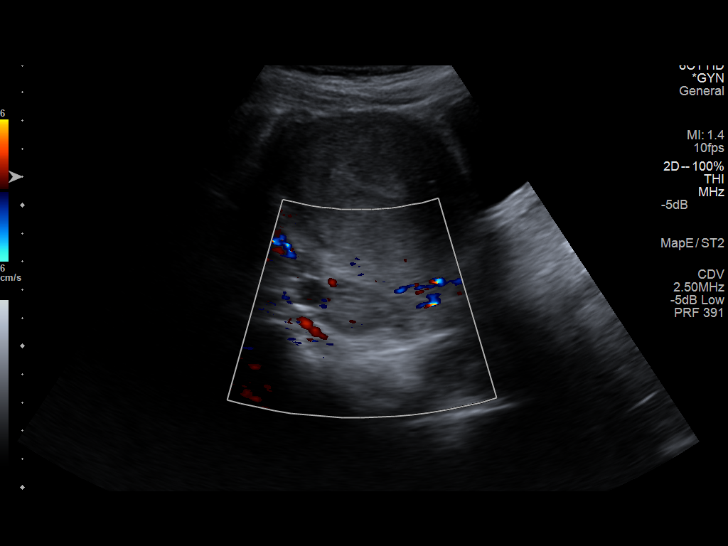
[im 20/23]
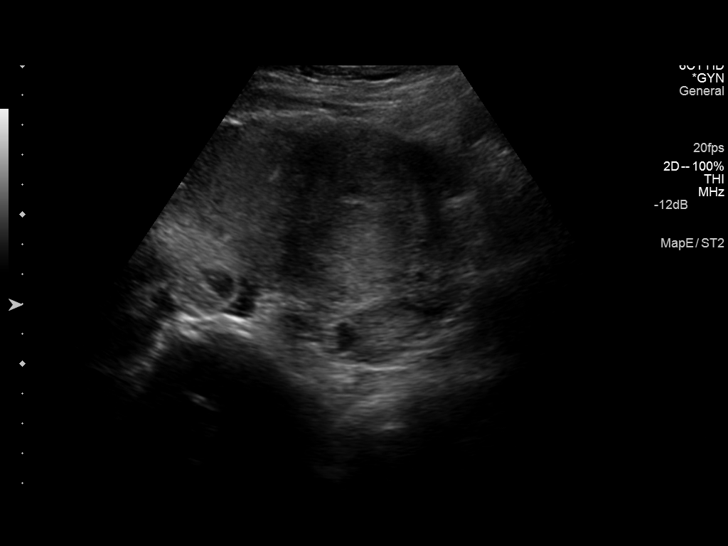

[Series 2: us pelvis complete · 0.25mm/px · 8 of 29 slices shown (2 of 2)]
[im 1/29]
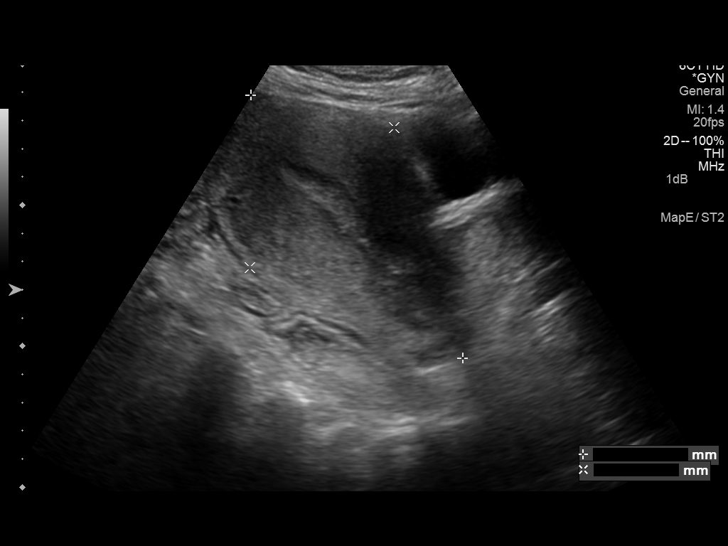
[im 5/29]
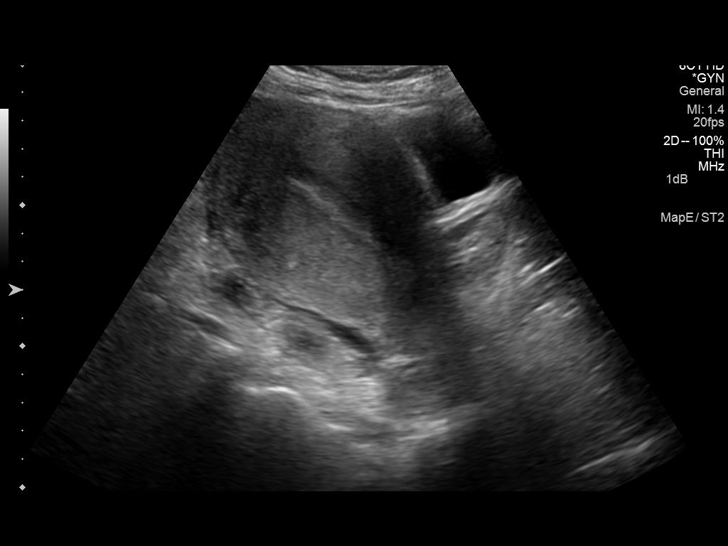
[im 9/29]
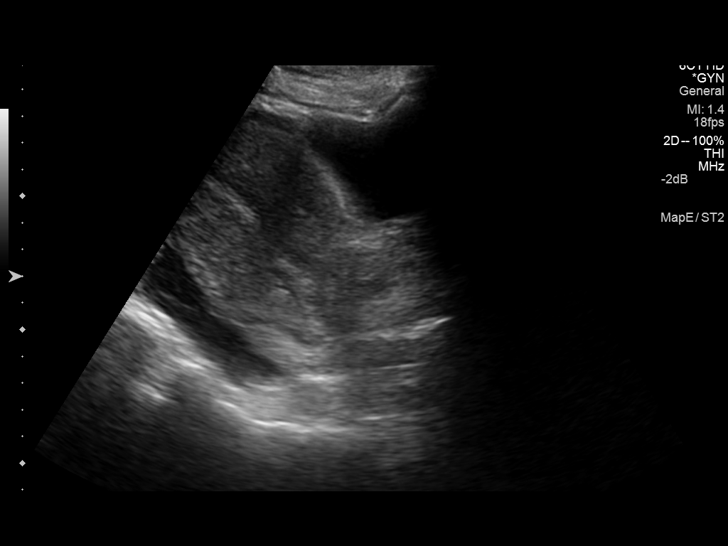
[im 11/29]
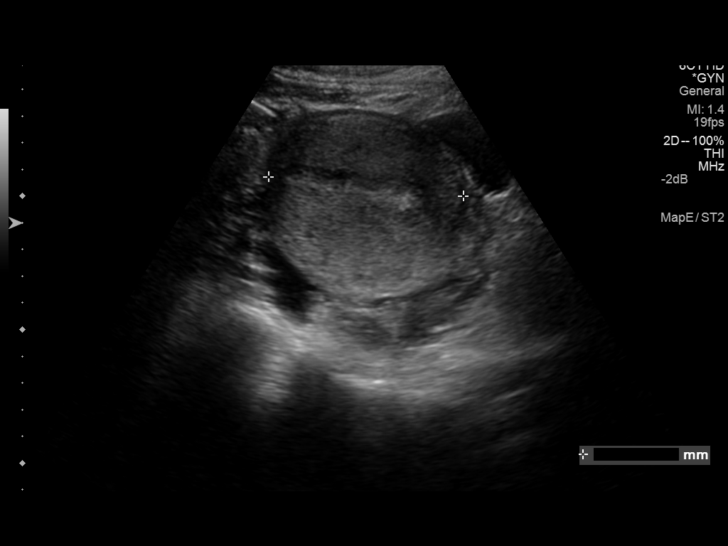
[im 16/29]
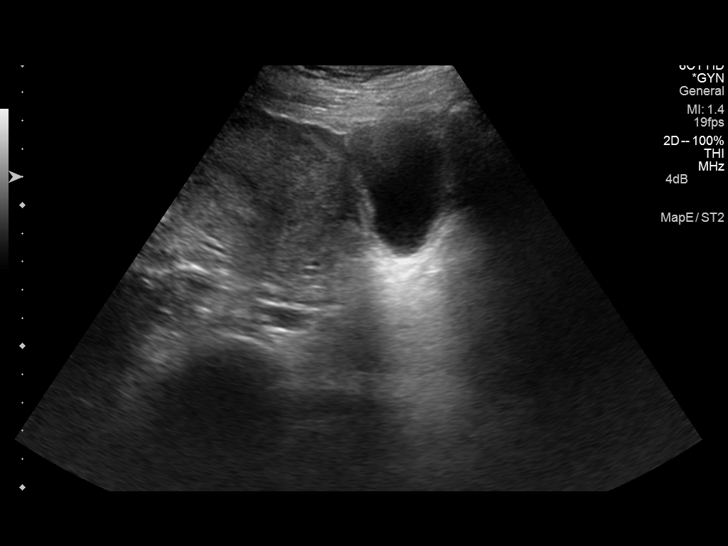
[im 20/29]
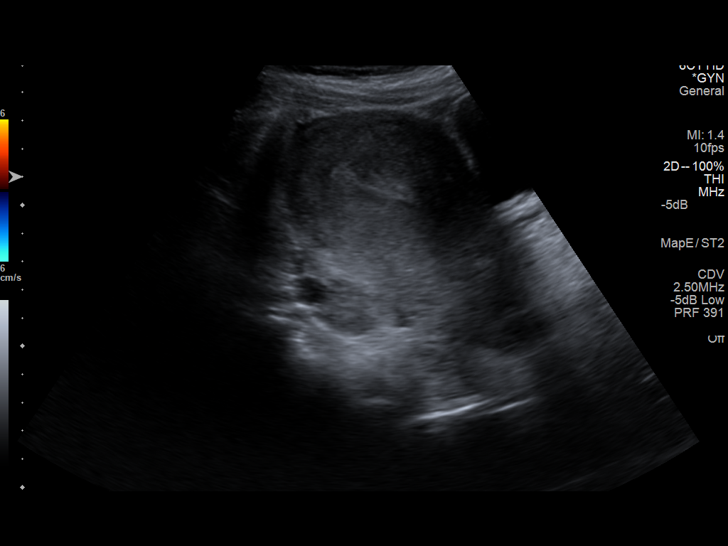
[im 24/29]
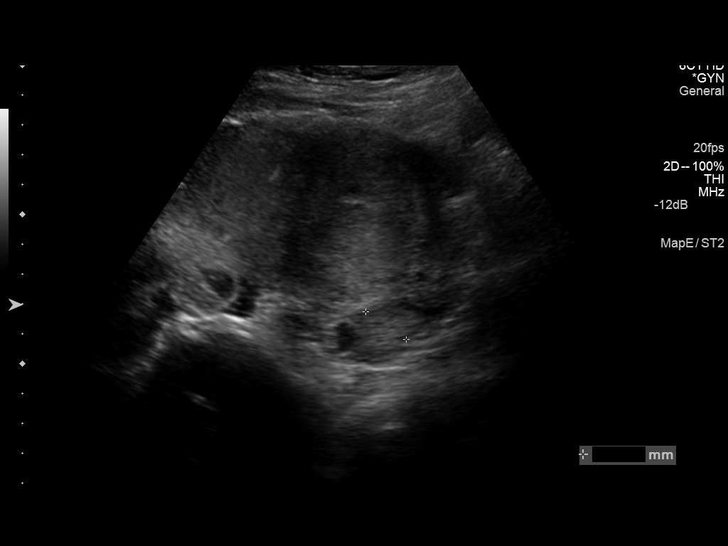
[im 29/29]
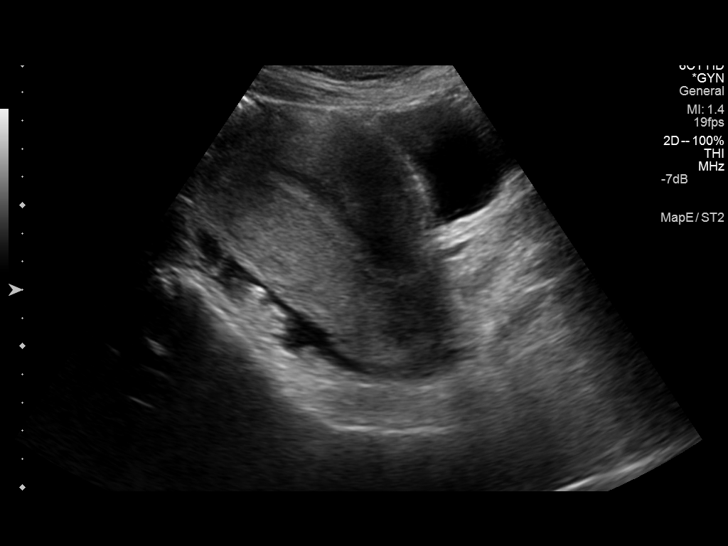

[14 of 25 positions shown; findings below may reference images not displayed]

FINDINGS: Uterus

Measurements: 12.0 x 7.1 x 7.3 cm.. No fibroids or other mass
visualized.

Endometrium

Thickness: 0.8 cm. Endometrial stripe is mildly heterogeneous. No
significant fluid within the endometrial cavity.

Right ovary

Measurements: Not visualized.

Left ovary

Measurements: 3.1 x 1.7 x 1.6 cm.. Normal appearance with a small
follicle.

Other findings:  Small amount of fluid in the cul-de-sac.
IMPRESSION: No evidence for an intrauterine pregnancy. Findings consistent with
recent spontaneous abortion.

Endometrial stripe is mildly heterogeneous without gross
abnormality. No clear evidence for retained products.

Small amount of free fluid.

Right ovary is not visualized.

## 2015-11-09 IMAGING — CR DG CHEST 1V PORT
1 series · 1 of 1 positions shown · non-contrast
Comparison: Radiograph 12/16/2014

CLINICAL DATA: Pulmonary edema.

EXAM:
PORTABLE CHEST - 1 VIEW

[AP]
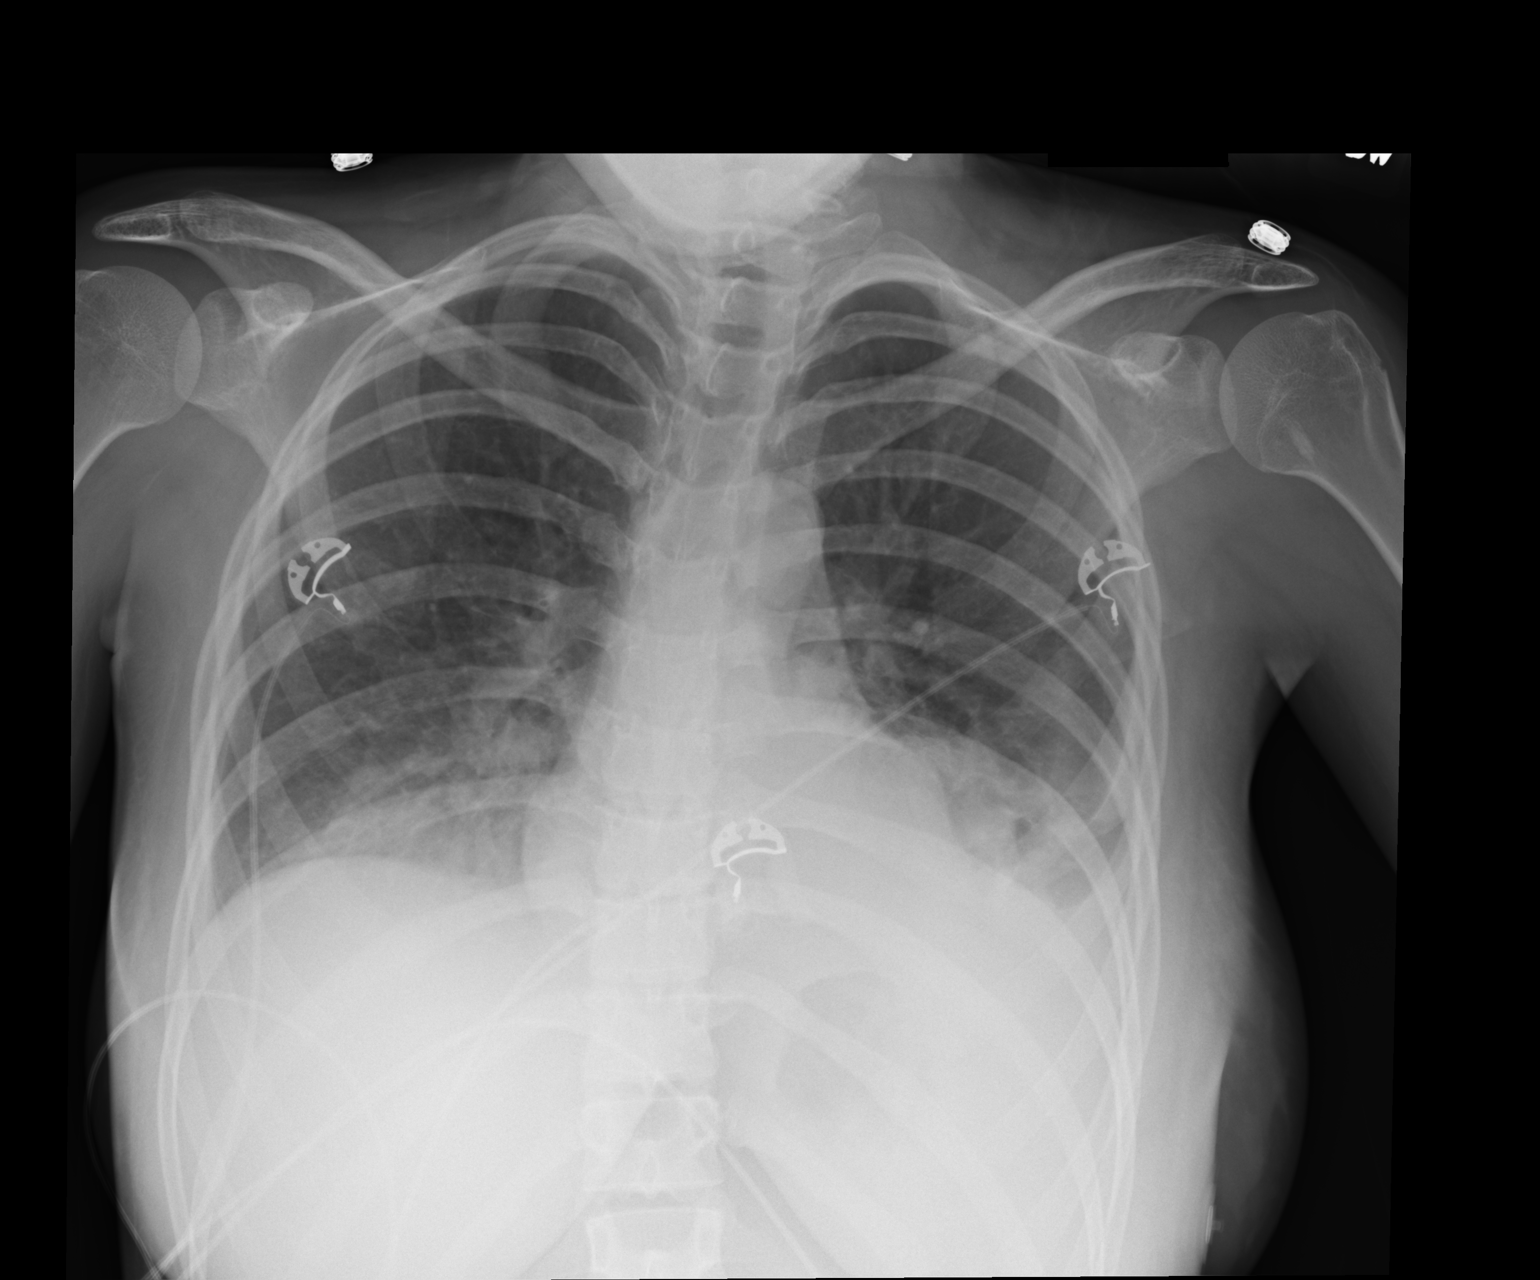

[1 of 1 positions shown; findings below may reference images not displayed]

FINDINGS: Interval removal of right central venous line. Normal cardiac
silhouette. There is improvement bilateral pleural effusions.
Bibasilar airspace disease is small effusions to remain. No
pneumothorax.
IMPRESSION: Improvement in pulmonary edema and pleural effusions.
